# Patient Record
Sex: Female | Born: 1995 | Race: Black or African American | Hispanic: No | Marital: Single | Smoking: Former smoker
Health system: Southern US, Community
[De-identification: ages and names within clinical notes are randomized; demographics above are authoritative.]

## PROBLEM LIST (undated history)

## (undated) ENCOUNTER — Inpatient Hospital Stay (HOSPITAL_COMMUNITY): Payer: Self-pay

## (undated) DIAGNOSIS — Z789 Other specified health status: Secondary | ICD-10-CM

## (undated) DIAGNOSIS — O1490 Unspecified pre-eclampsia, unspecified trimester: Secondary | ICD-10-CM

## (undated) HISTORY — PX: NO PAST SURGERIES: SHX2092

---

## 2016-09-03 ENCOUNTER — Encounter (HOSPITAL_BASED_OUTPATIENT_CLINIC_OR_DEPARTMENT_OTHER): Payer: Self-pay | Admitting: *Deleted

## 2016-09-03 ENCOUNTER — Emergency Department (HOSPITAL_BASED_OUTPATIENT_CLINIC_OR_DEPARTMENT_OTHER)
Admission: EM | Admit: 2016-09-03 | Discharge: 2016-09-03 | Disposition: A | Discharge status: Home / Self Care | Payer: Medicaid Other | Attending: Emergency Medicine | Admitting: Emergency Medicine

## 2016-09-03 ENCOUNTER — Emergency Department (HOSPITAL_BASED_OUTPATIENT_CLINIC_OR_DEPARTMENT_OTHER): Payer: Medicaid Other

## 2016-09-03 DIAGNOSIS — F17210 Nicotine dependence, cigarettes, uncomplicated: Secondary | ICD-10-CM | POA: Diagnosis not present

## 2016-09-03 DIAGNOSIS — Z3A00 Weeks of gestation of pregnancy not specified: Secondary | ICD-10-CM | POA: Insufficient documentation

## 2016-09-03 DIAGNOSIS — Z3201 Encounter for pregnancy test, result positive: Secondary | ICD-10-CM | POA: Diagnosis not present

## 2016-09-03 DIAGNOSIS — Z3490 Encounter for supervision of normal pregnancy, unspecified, unspecified trimester: Secondary | ICD-10-CM

## 2016-09-03 DIAGNOSIS — B9689 Other specified bacterial agents as the cause of diseases classified elsewhere: Secondary | ICD-10-CM

## 2016-09-03 DIAGNOSIS — N760 Acute vaginitis: Secondary | ICD-10-CM

## 2016-09-03 LAB — URINALYSIS, ROUTINE W REFLEX MICROSCOPIC
Bilirubin Urine: NEGATIVE
Glucose, UA: NEGATIVE mg/dL
Hgb urine dipstick: NEGATIVE
KETONES UR: NEGATIVE mg/dL
LEUKOCYTES UA: NEGATIVE
NITRITE: NEGATIVE
PROTEIN: NEGATIVE mg/dL
Specific Gravity, Urine: 1.01 (ref 1.005–1.030)
pH: 6.5 (ref 5.0–8.0)

## 2016-09-03 LAB — WET PREP, GENITAL
Sperm: NONE SEEN
TRICH WET PREP: NONE SEEN
Yeast Wet Prep HPF POC: NONE SEEN

## 2016-09-03 LAB — HCG, QUANTITATIVE, PREGNANCY: hCG, Beta Chain, Quant, S: 175 m[IU]/mL — ABNORMAL HIGH (ref <5)

## 2016-09-03 MED ORDER — METRONIDAZOLE 500 MG PO TABS: 500.0000 mg | ORAL_TABLET | Freq: Two times a day (BID) | ORAL | 0 refills | Status: DC

## 2016-09-03 NOTE — ED Triage Notes (Addendum)
Pt sent here from [facility] for US to r/o etopic , denies any complaints or pain

## 2016-09-03 NOTE — ED Provider Notes (Signed)
[facility]-EMERGENCY DEPT [facility] Provider Note   CSN: [id number] Arrival date & time: 09/03/16  1720  By signing my name below, I, [name], attest that this documentation has been prepared under the direction and in the presence of Tilden Fossa, MD . Electronically Signed: [name], Scribe. 09/03/2016. 8:15 PM.  History   Chief Complaint Chief Complaint  Patient presents with  . Pregnancy Ultrasound    HPI Comments Jamie Bautista is a 21 y.o. female with no reported PMHx, who presents to the Emergency Department to confirm her pregnancy and r/o eptopic.  [facility] for [facility] this morning, when a urine pregnancy test was positive.  She was sent to [facility] when an abdominal US was performed with no detection of the fetus.  By [facility] on April 14th, pt should be 10wks which concerned the resource center.  They sent her to the ED to r/o ectopic.  Here, pt reports a hx of irregular menses, stating that her LKMP could be an inaccurate date.  Her only complaint is some lower abdominal pain and cramping.  Pt G3A2;  one miscarriage at 5wks and one induced abortion with pills.  No complications with either.  Pt denies experiencing any other acute sx.  The history is provided by the patient. No language interpreter was used.    History reviewed. No pertinent past medical history.  There are no active problems to display for this patient.   History reviewed. No pertinent surgical history.  OB History    No data available       Home Medications    Prior to Admission medications   Medication Sig Start Date End Date Taking? Authorizing Provider  metroNIDAZOLE (FLAGYL) 500 MG tablet Take 1 tablet (500 mg total) by mouth 2 (two) times daily. 09/03/16   Tilden Fossa, MD    Family History History reviewed. No pertinent family history.  Social History Social History  Substance Use Topics  . Smoking status: Current Every Day Smoker    Packs/day: 0.50    Types: Cigarettes  . Smokeless tobacco: Never Used  . Alcohol use No     Allergies   Patient has no known allergies.   Review of Systems Review of Systems  Constitutional: Negative for chills and fever.  Respiratory: Negative for shortness of breath.   Cardiovascular: Negative for chest pain.  Gastrointestinal: Positive for abdominal pain. Negative for nausea and vomiting.  Genitourinary: Negative for dysuria, pelvic pain, vaginal bleeding, vaginal discharge and vaginal pain.  Musculoskeletal: Negative for back pain.  Skin: Negative for pallor.  All other systems reviewed and are negative.  Physical Exam Updated Vital Signs BP 139/89 (BP Location: Left Arm)   Pulse 70   Temp 99 F (37.2 C) (Oral)   Resp 18   Ht 5\' 2"  (1.575 m)   Wt 171 lb (77.6 kg)   LMP 06/22/2016   SpO2 100%   BMI 31.28 kg/m   Physical Exam  Constitutional: She is oriented to person, place, and time. She appears well-developed and well-nourished.  HENT:  Head: Normocephalic and atraumatic.  Cardiovascular: Normal rate and regular rhythm.   No murmur heard. Pulmonary/Chest: Effort normal and breath sounds normal. No respiratory distress.  Abdominal: Soft. There is no tenderness. There is no rebound and no guarding.  Genitourinary: Vagina normal. No vaginal discharge found.  Genitourinary Comments: Os closed.  No CMT or adnexal tenderness  Musculoskeletal: She exhibits no edema or tenderness.  Neurological: She  is alert and oriented to person, place, and time.  Skin: Skin is warm and dry.  Psychiatric: She has a normal mood and affect. Her behavior is normal.  Nursing note and vitals reviewed.    ED Treatments / Results  DIAGNOSTIC STUDIES: Oxygen Saturation is 100% on room air, normal by my interpretation.    COORDINATION OF CARE: 5:41 PM Discussed treatment plan with pt at bedside and pt agreed to plan.  Labs (all labs ordered are listed, but only abnormal results  are displayed) Labs Reviewed  WET PREP, GENITAL - Abnormal; Notable for the following:       Result Value   Clue Cells Wet Prep HPF POC PRESENT (*)    WBC, Wet Prep HPF POC FEW (*)    All other components within normal limits  URINALYSIS, ROUTINE W REFLEX MICROSCOPIC - Abnormal; Notable for the following:    Color, Urine STRAW (*)    All other components within normal limits  HCG, QUANTITATIVE, PREGNANCY - Abnormal; Notable for the following:    hCG, Beta Chain, Quant, S 175 (*)    All other components within normal limits  RPR  HIV ANTIBODY (ROUTINE TESTING)  GC/CHLAMYDIA PROBE AMP ([place]) NOT [facility]    EKG  EKG Interpretation None       Radiology [place] Ob Comp < 14 Wks  Result Date: 09/03/2016 CLINICAL DATA:  Pregnant patient with pelvic pain. EXAM: OBSTETRIC <14 WK US AND TRANSVAGINAL OB US TECHNIQUE: Both transabdominal and transvaginal ultrasound examinations were performed for complete evaluation of the gestation as well as the maternal uterus, adnexal regions, and pelvic cul-de-sac. Transvaginal technique was performed to assess early pregnancy. COMPARISON:  None. FINDINGS: Intrauterine gestational sac: None Yolk sac:  Not Visualized. Embryo:  Not Visualized. Cardiac Activity: Not Visualized. Maternal uterus/adnexae: Within the left ovary there is a 2.3 x 1.6 cm complex cystic lesion suggestive of corpus luteum. This lesion appears to be located within the left ovary. The right ovary is normal in appearance. IMPRESSION: No intrauterine gestation identified. In the setting of positive pregnancy test and no definite intrauterine pregnancy, this reflects a pregnancy of unknown location. Differential considerations include early normal IUP, abnormal IUP, or nonvisualized ectopic pregnancy. Differentiation is achieved with serial beta HCG supplemented by repeat sonography as clinically warranted. Electronically Signed   By: Annia Beltrew  Davis M.D.   On: 09/03/2016 19:47   [place] Ob  Transvaginal  Result Date: 09/03/2016 CLINICAL DATA:  Pregnant patient with pelvic pain. EXAM: OBSTETRIC <14 WK US AND TRANSVAGINAL OB US TECHNIQUE: Both transabdominal and transvaginal ultrasound examinations were performed for complete evaluation of the gestation as well as the maternal uterus, adnexal regions, and pelvic cul-de-sac. Transvaginal technique was performed to assess early pregnancy. COMPARISON:  None. FINDINGS: Intrauterine gestational sac: None Yolk sac:  Not Visualized. Embryo:  Not Visualized. Cardiac Activity: Not Visualized. Maternal uterus/adnexae: Within the left ovary there is a 2.3 x 1.6 cm complex cystic lesion suggestive of corpus luteum. This lesion appears to be located within the left ovary. The right ovary is normal in appearance. IMPRESSION: No intrauterine gestation identified. In the setting of positive pregnancy test and no definite intrauterine pregnancy, this reflects a pregnancy of unknown location. Differential considerations include early normal IUP, abnormal IUP, or nonvisualized ectopic pregnancy. Differentiation is achieved with serial beta HCG supplemented by repeat sonography as clinically warranted. Electronically Signed   By: Annia Beltrew  Davis M.D.   On: 09/03/2016 19:47   Procedures Procedures (including critical care  time)  Medications Ordered in ED Medications - No data to display   Initial Impression / Assessment and Plan / ED Course  I have reviewed the triage vital signs and the nursing notes.  Pertinent labs & imaging results that were available during my care of the patient were reviewed by me and considered in my medical decision making (see chart for details).     Patient here for evaluation of positive urine pregnancy test. She has had mild lower abdominal cramping over the last week but nothing significant. Pelvic examination is benign with no evidence of active bleeding. Quantitative hCG is 175 and pelvic ultrasound with no evidence of  pregnancy. Discussed with patient importance of OB/GYN follow-up for close recheck. Discussed close return precautions. Discussed it is unclear if she has a normal pregnancy at this time.  I personally performed the services described in this documentation, which was scribed in my presence. The recorded information has been reviewed and is accurate.  Final Clinical Impressions(s) / ED Diagnoses   Final diagnoses:  Early stage of pregnancy  BV (bacterial vaginosis)    New Prescriptions New Prescriptions   METRONIDAZOLE (FLAGYL) 500 MG TABLET    Take 1 tablet (500 mg total) by mouth 2 (two) times daily.     Tilden Fossa, MD 09/03/16 2021

## 2016-09-03 NOTE — Discharge Instructions (Signed)
Start taking a prenatal vitamin, available over-the-counter. Your pregnancy hormone was 175 today. You are to early along in your pregnancy to determine if this is a normal or healthy pregnancy. Please contact your OB/GYN for recheck in the next few days. Get seen immediately if you develop abdominal pain, vaginal bleeding or new concerning symptoms.

## 2016-09-03 NOTE — ED Notes (Signed)
Pt verbalizes understanding of d/c instructions and denies any further need at this time. 

## 2016-09-03 NOTE — ED Notes (Signed)
Patient transported to Ultrasound 

## 2016-09-04 LAB — GC/CHLAMYDIA PROBE AMP (~~LOC~~) NOT AT ARMC
CHLAMYDIA, DNA PROBE: NEGATIVE
Neisseria Gonorrhea: NEGATIVE

## 2016-09-06 LAB — HIV ANTIBODY (ROUTINE TESTING W REFLEX): HIV SCREEN 4TH GENERATION: NONREACTIVE

## 2016-09-06 LAB — RPR: RPR Ser Ql: NONREACTIVE

## 2016-09-12 ENCOUNTER — Emergency Department (HOSPITAL_BASED_OUTPATIENT_CLINIC_OR_DEPARTMENT_OTHER)
Admission: EM | Admit: 2016-09-12 | Discharge: 2016-09-12 | Disposition: A | Discharge status: Home / Self Care | Payer: Medicaid Other | Attending: Emergency Medicine | Admitting: Emergency Medicine

## 2016-09-12 ENCOUNTER — Emergency Department (HOSPITAL_BASED_OUTPATIENT_CLINIC_OR_DEPARTMENT_OTHER): Payer: Medicaid Other

## 2016-09-12 ENCOUNTER — Encounter (HOSPITAL_BASED_OUTPATIENT_CLINIC_OR_DEPARTMENT_OTHER): Payer: Self-pay | Admitting: Emergency Medicine

## 2016-09-12 DIAGNOSIS — F17210 Nicotine dependence, cigarettes, uncomplicated: Secondary | ICD-10-CM | POA: Insufficient documentation

## 2016-09-12 DIAGNOSIS — O200 Threatened abortion: Secondary | ICD-10-CM | POA: Diagnosis not present

## 2016-09-12 DIAGNOSIS — O209 Hemorrhage in early pregnancy, unspecified: Secondary | ICD-10-CM | POA: Diagnosis present

## 2016-09-12 DIAGNOSIS — Z3A01 Less than 8 weeks gestation of pregnancy: Secondary | ICD-10-CM | POA: Insufficient documentation

## 2016-09-12 LAB — CBC
HCT: 34.6 % — ABNORMAL LOW (ref 36.0–46.0)
HEMOGLOBIN: 11.8 g/dL — AB (ref 12.0–15.0)
MCH: 30.2 pg (ref 26.0–34.0)
MCHC: 34.1 g/dL (ref 30.0–36.0)
MCV: 88.5 fL (ref 78.0–100.0)
PLATELETS: 230 10*3/uL (ref 150–400)
RBC: 3.91 MIL/uL (ref 3.87–5.11)
RDW: 14.9 % (ref 11.5–15.5)
WBC: 5.8 10*3/uL (ref 4.0–10.5)

## 2016-09-12 LAB — HCG, QUANTITATIVE, PREGNANCY: HCG, BETA CHAIN, QUANT, S: 8069 m[IU]/mL — AB (ref <5)

## 2016-09-12 NOTE — ED Provider Notes (Signed)
[facility]-EMERGENCY DEPT [facility] Provider Note   CSN: [id number] Arrival date & time: 09/12/16  1115     History   Chief Complaint Chief Complaint  Patient presents with  . Vaginal Bleeding    HPI Jamie Bautista is a 21 y.o. female.  HPI Patient is a G3 P0 A2 who presents to emergency department with possible six-week pregnancy.  She states she had an ultrasound and a Quant done on June 26 here the emergency department.  At that time her Sharene Butters was 175 and her ultrasound demonstrated no obvious intrauterine pregnancy at that time.  She states she's been doing well until she developed some vaginal bleeding today.  She reports some crampy lower abdominal pain.  She's had one miscarriage and one abortion.  She does not have a gynecologist at this time.  She denies fevers and chills.  No lightheadedness or syncope.  No upper abdominal pain.   History reviewed. No pertinent past medical history.  There are no active problems to display for this patient.   History reviewed. No pertinent surgical history.  OB History    Gravida Para Term Preterm AB Living   1             SAB TAB Ectopic Multiple Live Births                   Home Medications    Prior to Admission medications   Not on File    Family History History reviewed. No pertinent family history.  Social History Social History  Substance Use Topics  . Smoking status: Current Every Day Smoker    Packs/day: 0.50    Types: Cigarettes  . Smokeless tobacco: Never Used  . Alcohol use No     Allergies   Patient has no known allergies.   Review of Systems Review of Systems  All other systems reviewed and are negative.    Physical Exam Updated Vital Signs BP 130/71   Pulse 81   Temp 98.3 F (36.8 C) (Oral)   Resp 18   Ht 5\' 2"  (1.575 m)   Wt 77.6 kg (171 lb)   LMP 06/22/2016   SpO2 100%   BMI 31.28 kg/m   Physical Exam  Constitutional: She is oriented to person, place, and time. She appears well-developed  and well-nourished.  HENT:  Head: Normocephalic.  Eyes: EOM are normal.  Neck: Normal range of motion.  Pulmonary/Chest: Effort normal.  Abdominal: Soft. She exhibits no distension. There is no tenderness.  Musculoskeletal: Normal range of motion.  Neurological: She is alert and oriented to person, place, and time.  Psychiatric: She has a normal mood and affect.  Nursing note and vitals reviewed.    ED Treatments / Results  Labs (all labs ordered are listed, but only abnormal results are displayed) Labs Reviewed  HCG, QUANTITATIVE, PREGNANCY - Abnormal; Notable for the following:       Result Value   hCG, Beta Chain, Quant, S 8,069 (*)    All other components within normal limits  CBC - Abnormal; Notable for the following:    Hemoglobin 11.8 (*)    HCT 34.6 (*)    All other components within normal limits    EKG  EKG Interpretation None       Radiology US Ob Comp < 14 Wks  Result Date: 09/12/2016 CLINICAL DATA:  Vaginal bleeding EXAM: OBSTETRIC <14 WK [place] AND TRANSVAGINAL OB US TECHNIQUE: Both transabdominal and transvaginal ultrasound examinations were performed for  complete evaluation of the gestation as well as the maternal uterus, adnexal regions, and pelvic cul-de-sac. Transvaginal technique was performed to assess early pregnancy. COMPARISON:  September 03, 2016 FINDINGS: Intrauterine gestational sac: Visualized Yolk sac:  Visualized Embryo:  Not visualized Cardiac Activity: Not visualized MSD: 7 mm     5 w   3  d Subchorionic hemorrhage: There is a subchorionic hemorrhage measuring 1.3 x 1.3 cm. Maternal uterus/adnexae: Cervical os is closed. There is a probable hemorrhagic corpus luteum on the left measuring 1.7 x 1 3 x 1.3 cm. No other extrauterine pelvic or adnexal mass. No free pelvic fluid. IMPRESSION: Gestational sac and yolk sac visualized. Fetal pole not yet seen. Given this circumstance, a followup study in 10-14 days advised to further evaluate. Based on gestational  sac size, estimated gestational age is 5+ weeks. Moderate subchorionic hemorrhage. Probable hemorrhagic corpus luteum on the left. Electronically Signed   By: Bretta BangWilliam  Woodruff III M.D.   On: 09/12/2016 12:57   [place] Ob Transvaginal  Result Date: 09/12/2016 CLINICAL DATA:  Vaginal bleeding EXAM: OBSTETRIC <14 WK US AND TRANSVAGINAL OB US TECHNIQUE: Both transabdominal and transvaginal ultrasound examinations were performed for complete evaluation of the gestation as well as the maternal uterus, adnexal regions, and pelvic cul-de-sac. Transvaginal technique was performed to assess early pregnancy. COMPARISON:  September 03, 2016 FINDINGS: Intrauterine gestational sac: Visualized Yolk sac:  Visualized Embryo:  Not visualized Cardiac Activity: Not visualized MSD: 7 mm     5 w   3  d Subchorionic hemorrhage: There is a subchorionic hemorrhage measuring 1.3 x 1.3 cm. Maternal uterus/adnexae: Cervical os is closed. There is a probable hemorrhagic corpus luteum on the left measuring 1.7 x 1 3 x 1.3 cm. No other extrauterine pelvic or adnexal mass. No free pelvic fluid. IMPRESSION: Gestational sac and yolk sac visualized. Fetal pole not yet seen. Given this circumstance, a followup study in 10-14 days advised to further evaluate. Based on gestational sac size, estimated gestational age is 5+ weeks. Moderate subchorionic hemorrhage. Probable hemorrhagic corpus luteum on the left. Electronically Signed   By: Bretta BangWilliam  Woodruff III M.D.   On: 09/12/2016 12:57    Procedures Procedures (including critical care time)  Medications Ordered in ED Medications - No data to display   Initial Impression / Assessment and Plan / ED Course  I have reviewed the triage vital signs and the nursing notes.  Pertinent labs & imaging results that were available during my care of the patient were reviewed by me and considered in my medical decision making (see chart for details).    Patient is overall well-appearing.  Her Quant continues  to rise and is just above 8000 today.  Ultrasound has progressed some and now demonstrates a gestational sac but there is still no visualized fetal pole.  She will need repeat ultrasound and repeat Quant.  I [facility].  I gave her strict return precautions including worsening bleeding worsening pain as well as symptoms of syncope and lightheadedness.  At this point it is still a threatened miscarriage   Final Clinical Impressions(s) / ED Diagnoses   Final diagnoses:  Threatened miscarriage    New Prescriptions Current Discharge Medication List       Azalia Bilisampos, Antanisha Mohs, MD 09/12/16 [phone]

## 2016-09-12 NOTE — ED Triage Notes (Signed)
Pt states she is [redacted] weeks pregnant and woke up this morning with vaginal bleeding.

## 2016-11-04 ENCOUNTER — Encounter (HOSPITAL_COMMUNITY): Payer: Self-pay

## 2016-12-17 ENCOUNTER — Encounter (HOSPITAL_COMMUNITY): Payer: Self-pay | Admitting: *Deleted

## 2016-12-17 ENCOUNTER — Other Ambulatory Visit (HOSPITAL_COMMUNITY): Payer: Self-pay | Admitting: Nurse Practitioner

## 2016-12-17 DIAGNOSIS — O280 Abnormal hematological finding on antenatal screening of mother: Secondary | ICD-10-CM

## 2016-12-17 DIAGNOSIS — Z3A20 20 weeks gestation of pregnancy: Secondary | ICD-10-CM

## 2016-12-17 DIAGNOSIS — Z3689 Encounter for other specified antenatal screening: Secondary | ICD-10-CM

## 2016-12-18 ENCOUNTER — Ambulatory Visit (HOSPITAL_COMMUNITY)
Admission: RE | Admit: 2016-12-18 | Discharge: 2016-12-18 | Disposition: A | Discharge status: Home / Self Care | Payer: Medicaid Other | Source: Ambulatory Visit | Attending: Nurse Practitioner | Admitting: Nurse Practitioner

## 2016-12-18 ENCOUNTER — Encounter (HOSPITAL_COMMUNITY): Payer: Self-pay

## 2016-12-18 DIAGNOSIS — Z3A19 19 weeks gestation of pregnancy: Secondary | ICD-10-CM | POA: Diagnosis not present

## 2016-12-18 DIAGNOSIS — Z3689 Encounter for other specified antenatal screening: Secondary | ICD-10-CM

## 2016-12-18 DIAGNOSIS — O289 Unspecified abnormal findings on antenatal screening of mother: Secondary | ICD-10-CM | POA: Insufficient documentation

## 2016-12-18 DIAGNOSIS — O280 Abnormal hematological finding on antenatal screening of mother: Secondary | ICD-10-CM | POA: Diagnosis present

## 2016-12-18 DIAGNOSIS — Z3A20 20 weeks gestation of pregnancy: Secondary | ICD-10-CM | POA: Insufficient documentation

## 2016-12-18 HISTORY — DX: Other specified health status: Z78.9

## 2016-12-18 NOTE — Progress Notes (Signed)
Genetic Counseling  DOB: 04/05/1995 Ref03/21/1997g Provider: Alberteen Spindle, NP Appointment Date: 12/18/16 Attending: Dr. Particia Nearing  Jamie Bautista was seen for genetic counseling because of an increased risk for fetal Down syndrome based on a maternal serum Quad screen.  In summary:  Reviewed results of screening test  Increased risk for Down syndrome  Reduction in risk for Trisomy 23 and ONTD  Discussed additional screening options  NIPS - declined today but will consider at later date  Ultrasound - performed today  Discussed diagnostic testing options  Amniocentesis - declined  Reviewed family history concerns - none reported  Discussed general population carrier screening options  CF  SMA  Hemoglobinopathies  She was counseled regarding the screening result and the associated 1 in 46 risk for fetal Down syndrome.  We reviewed chromosomes, nondisjunction, and the common features and variable prognosis of Down syndrome.  In addition, we reviewed the screen adjusted reduction in risks for trisomy 18 and open neural tube defects.  We also discussed other explanations for a screen positive result including: a gestational dating error, differences in maternal metabolism, and normal variation.  We reviewed other available screening options including noninvasive prenatal screening (NIPS)/cell free DNA (cfDNA) screening, and detailed ultrasound.  She was counseled that screening tests are used to modify a patient's a priori risk for aneuploidy, typically based on age. This estimate provides a pregnancy specific risk assessment. We reviewed the benefits and limitations of each option. Specifically, we discussed the conditions for which each test screens, the detection rates, and false positive rates of each. She was also counseled regarding diagnostic testing via amniocentesis. We reviewed the approximate 1 in 300-500 risk for complications from amniocentesis, including  spontaneous pregnancy loss. We discussed the possible results that the tests might provide including: positive, negative, unanticipated, and no result. Finally, she was counseled regarding the cost of each option and potential out of pocket expenses.   A complete ultrasound was performed today. The ultrasound report will be sent under separate cover.  An isolated echogenic intracardiac focus (EIF) was seen on ultrasound today.  We discussed that this is generally believed to be a normal variation without any concerns for the pregnancy.  Isolated EIFs are not associated with congenital heart defects in the baby or compromised cardiac function after birth.  However, an echogenic cardiac focus is associated with a slightly increased chance for Down syndrome in the pregnancy.  There were no other visualized fetal anomalies or markers suggestive of aneuploidy. Additional screening and diagnostic testing were declined today.  She understands that screening tests cannot rule out all birth defects or genetic syndromes. The patient was advised of this limitation and states she still does not want additional testing or screening at this time.   [name] was provided with written information regarding cystic fibrosis (CF), spinal muscular atrophy (SMA) and hemoglobinopathies including the carrier frequency, availability of carrier screening and prenatal diagnosis if indicated.  In addition, we discussed that CF and hemoglobinopathies are routinely screened for as part of the [facility] newborn screening panel. She was counseled that she has previously undergone screening for CF and hemoglobin disorders, both which were normal per her medical records.  She declined screening for SMA today.  Both family histories were reviewed and found to be noncontributory for birth defects, genetic conditions or intellectual disabilites. Without further information regarding the provided family history, an accurate genetic risk cannot be  calculated. Further genetic counseling is warranted if more information is  obtained.  [name] denied exposure to environmental toxins or chemical agents. She denied the use of alcohol, tobacco or street drugs. She denied significant viral illnesses during the course of her pregnancy. Her medical and surgical histories were noncontributory.   I counseled [name] for approximately 45 minutes regarding the above risks and available options.   Jamie Gemma, MS,  Certified Genetic Counselor

## 2016-12-24 ENCOUNTER — Other Ambulatory Visit (HOSPITAL_COMMUNITY): Payer: Self-pay

## 2016-12-25 ENCOUNTER — Other Ambulatory Visit (HOSPITAL_COMMUNITY): Payer: Self-pay

## 2017-01-11 ENCOUNTER — Encounter (HOSPITAL_COMMUNITY): Payer: Self-pay

## 2017-01-11 ENCOUNTER — Inpatient Hospital Stay (HOSPITAL_COMMUNITY)
Admission: AD | Admit: 2017-01-11 | Discharge: 2017-01-11 | Disposition: A | Discharge status: Home / Self Care | Payer: Medicaid Other | Source: Ambulatory Visit | Attending: Obstetrics and Gynecology | Admitting: Obstetrics and Gynecology

## 2017-01-11 DIAGNOSIS — R350 Frequency of micturition: Secondary | ICD-10-CM

## 2017-01-11 DIAGNOSIS — O23592 Infection of other part of genital tract in pregnancy, second trimester: Secondary | ICD-10-CM | POA: Insufficient documentation

## 2017-01-11 DIAGNOSIS — Z3A23 23 weeks gestation of pregnancy: Secondary | ICD-10-CM | POA: Diagnosis not present

## 2017-01-11 DIAGNOSIS — R309 Painful micturition, unspecified: Secondary | ICD-10-CM

## 2017-01-11 DIAGNOSIS — R300 Dysuria: Secondary | ICD-10-CM | POA: Insufficient documentation

## 2017-01-11 DIAGNOSIS — R310 Gross hematuria: Secondary | ICD-10-CM | POA: Diagnosis not present

## 2017-01-11 DIAGNOSIS — Z87891 Personal history of nicotine dependence: Secondary | ICD-10-CM | POA: Diagnosis not present

## 2017-01-11 DIAGNOSIS — O9989 Other specified diseases and conditions complicating pregnancy, childbirth and the puerperium: Secondary | ICD-10-CM

## 2017-01-11 LAB — WET PREP, GENITAL
Clue Cells Wet Prep HPF POC: NONE SEEN
SPERM: NONE SEEN
Trich, Wet Prep: NONE SEEN
YEAST WET PREP: NONE SEEN

## 2017-01-11 LAB — URINALYSIS, ROUTINE W REFLEX MICROSCOPIC
Bilirubin Urine: NEGATIVE
GLUCOSE, UA: NEGATIVE mg/dL
Hgb urine dipstick: NEGATIVE
KETONES UR: 20 mg/dL — AB
LEUKOCYTES UA: NEGATIVE
NITRITE: NEGATIVE
PH: 5 (ref 5.0–8.0)
Protein, ur: NEGATIVE mg/dL
Specific Gravity, Urine: 1.023 (ref 1.005–1.030)

## 2017-01-11 MED ORDER — NITROFURANTOIN MONOHYD MACRO 100 MG PO CAPS: 100.0000 mg | ORAL_CAPSULE | Freq: Two times a day (BID) | ORAL | 0 refills | Status: DC

## 2017-01-11 NOTE — Discharge Instructions (Signed)

## 2017-01-11 NOTE — MAU Provider Note (Signed)
History   G3P0020 @ 23.4wks in with c/o blood in urine, burning with urination and urinary frequency. Denies ROM or vag bleeding.  CSN: [id number]  Arrival date & time 01/11/17  1830   None     Chief Complaint  Patient presents with  . Abdominal Pain  . Vaginal Bleeding  . Urinary Retention    HPI  Past Medical History:  Diagnosis Date  . Medical history non-contributory     Past Surgical History:  Procedure Laterality Date  . NO PAST SURGERIES      No family history on file.  Social History  Substance Use Topics  . Smoking status: Former Smoker    Packs/day: 0.50    Types: Cigarettes    Quit date: 10/27/2016  . Smokeless tobacco: Never Used  . Alcohol use No    OB History    Gravida Para Term Preterm AB Living   3       2 0   SAB TAB Ectopic Multiple Live Births   1 1            Review of Systems  Constitutional: Negative.   HENT: Negative.   Eyes: Negative.   Respiratory: Negative.   Cardiovascular: Negative.   Gastrointestinal: Positive for abdominal pain.  Endocrine: Negative.   Genitourinary: Positive for dysuria and frequency.  Musculoskeletal: Negative.   Skin: Negative.   Allergic/Immunologic: Negative.   Neurological: Negative.   Hematological: Negative.   Psychiatric/Behavioral: Negative.     Allergies  Patient has no known allergies.  Home Medications    BP 136/72   Pulse 82   Temp 98.8 F (37.1 C) (Oral)   Resp 16   LMP 06/22/2016 (Approximate)   SpO2 100%   Physical Exam  Constitutional: She is oriented to person, place, and time. She appears well-developed and well-nourished.  HENT:  Head: Normocephalic.  Eyes: Pupils are equal, round, and reactive to light.  Neck: Normal range of motion.  Cardiovascular: Normal rate, regular rhythm, normal heart sounds and intact distal pulses.   Pulmonary/Chest: Effort normal and breath sounds normal.  Abdominal: Soft. Bowel sounds are normal. There is tenderness.  Genitourinary:  Vagina normal and uterus normal.  Musculoskeletal: Normal range of motion.  Neurological: She is alert and oriented to person, place, and time. She has normal reflexes.  Skin: Skin is warm and dry.  Psychiatric: She has a normal mood and affect. Her behavior is normal. Judgment and thought content normal.    MAU Course  Procedures (including critical care time)  Labs Reviewed  WET PREP, GENITAL  URINALYSIS, ROUTINE W REFLEX MICROSCOPIC  GC/CHLAMYDIA PROBE AMP ([place]) NOT AT [facility]   No results found.   1. Gross hematuria   2. Urinary frequency   3. Pain with urination       MDM  VSS, FHR pattern reassurring, no decels, no uc's. SVE cl/firm/th/high. Wet prep . Cultures done. U/s neg . But since pt is symptomatic will culture and treat.

## 2017-01-11 NOTE — Progress Notes (Addendum)
G3P0 @ 23.[redacted] wksga. Presents to triage for vaginal bleeding, abdominal pain and painful urination. Denies LOF. + flutter movements.   EFM applied.   Provider at bs assessing.   2003: D/c instructions given with pt understanding. Pt left unit via ambulatory.

## 2017-01-13 LAB — URINE CULTURE

## 2017-01-13 LAB — GC/CHLAMYDIA PROBE AMP (~~LOC~~) NOT AT ARMC
Chlamydia: NEGATIVE
NEISSERIA GONORRHEA: NEGATIVE

## 2017-03-14 ENCOUNTER — Encounter (HOSPITAL_COMMUNITY): Payer: Self-pay | Admitting: *Deleted

## 2017-03-14 ENCOUNTER — Inpatient Hospital Stay (HOSPITAL_COMMUNITY)
Admission: AD | Admit: 2017-03-14 | Discharge: 2017-03-14 | Disposition: A | Discharge status: Home / Self Care | Payer: Medicaid Other | Source: Ambulatory Visit | Attending: Obstetrics & Gynecology | Admitting: Obstetrics & Gynecology

## 2017-03-14 DIAGNOSIS — O133 Gestational [pregnancy-induced] hypertension without significant proteinuria, third trimester: Secondary | ICD-10-CM | POA: Diagnosis not present

## 2017-03-14 DIAGNOSIS — Z3A32 32 weeks gestation of pregnancy: Secondary | ICD-10-CM

## 2017-03-14 DIAGNOSIS — O163 Unspecified maternal hypertension, third trimester: Secondary | ICD-10-CM | POA: Insufficient documentation

## 2017-03-14 DIAGNOSIS — Z79899 Other long term (current) drug therapy: Secondary | ICD-10-CM | POA: Insufficient documentation

## 2017-03-14 DIAGNOSIS — Z87891 Personal history of nicotine dependence: Secondary | ICD-10-CM | POA: Insufficient documentation

## 2017-03-14 LAB — URINALYSIS, ROUTINE W REFLEX MICROSCOPIC
Bilirubin Urine: NEGATIVE
Glucose, UA: NEGATIVE mg/dL
Hgb urine dipstick: NEGATIVE
KETONES UR: NEGATIVE mg/dL
LEUKOCYTES UA: NEGATIVE
NITRITE: NEGATIVE
PH: 7 (ref 5.0–8.0)
PROTEIN: NEGATIVE mg/dL
Specific Gravity, Urine: 1.014 (ref 1.005–1.030)

## 2017-03-14 LAB — CBC
HEMATOCRIT: 32.6 % — AB (ref 36.0–46.0)
Hemoglobin: 10.8 g/dL — ABNORMAL LOW (ref 12.0–15.0)
MCH: 29.8 pg (ref 26.0–34.0)
MCHC: 33.1 g/dL (ref 30.0–36.0)
MCV: 89.8 fL (ref 78.0–100.0)
Platelets: 147 10*3/uL — ABNORMAL LOW (ref 150–400)
RBC: 3.63 MIL/uL — ABNORMAL LOW (ref 3.87–5.11)
RDW: 14 % (ref 11.5–15.5)
WBC: 9.4 10*3/uL (ref 4.0–10.5)

## 2017-03-14 LAB — COMPREHENSIVE METABOLIC PANEL
ALBUMIN: 2.7 g/dL — AB (ref 3.5–5.0)
ALT: 10 U/L — ABNORMAL LOW (ref 14–54)
ANION GAP: 7 (ref 5–15)
AST: 17 U/L (ref 15–41)
Alkaline Phosphatase: 120 U/L (ref 38–126)
BILIRUBIN TOTAL: 0.3 mg/dL (ref 0.3–1.2)
BUN: 10 mg/dL (ref 6–20)
CO2: 22 mmol/L (ref 22–32)
Calcium: 8.6 mg/dL — ABNORMAL LOW (ref 8.9–10.3)
Chloride: 106 mmol/L (ref 101–111)
Creatinine, Ser: 0.73 mg/dL (ref 0.44–1.00)
GFR calc Af Amer: >60 mL/min (ref >60)
Glucose, Bld: 94 mg/dL (ref 65–99)
POTASSIUM: 4 mmol/L (ref 3.5–5.1)
Sodium: 135 mmol/L (ref 135–145)
TOTAL PROTEIN: 6.3 g/dL — AB (ref 6.5–8.1)

## 2017-03-14 LAB — PROTEIN / CREATININE RATIO, URINE
Creatinine, Urine: 73 mg/dL
PROTEIN CREATININE RATIO: 0.11 mg/mg{creat} (ref 0.00–0.15)
Total Protein, Urine: 8 mg/dL

## 2017-03-14 NOTE — MAU Provider Note (Signed)
History     CSN: [id number]  Arrival date and time: 03/14/17 6314   First Provider Initiated Contact with Patient 03/14/17 1917     Chief Complaint  Patient presents with  . Hypertension   HPI Jamie Bautista is a 22 y.o. G3P0020 at [redacted]w[redacted]d who presents for evaluation of elevated blood pressures. [facility] and states her BP was 150s/90s. She was sent home when the recheck was normal but was called today and told to come to MAU for labs. She denies any headache, visual changes or epigastric pain. She denies any vaginal bleeding or leaking. Denies pain. Reports good fetal movement.   OB History    Gravida Para Term Preterm AB Living   3       2 0   SAB TAB Ectopic Multiple Live Births   1 1            Past Medical History:  Diagnosis Date  . Medical history non-contributory     Past Surgical History:  Procedure Laterality Date  . NO PAST SURGERIES      History reviewed. No pertinent family history.  Social History   Tobacco Use  . Smoking status: Former Smoker    Packs/day: 0.50    Types: Cigarettes    Last attempt to quit: 10/27/2016    Years since quitting: 0.3  . Smokeless tobacco: Never Used  Substance Use Topics  . Alcohol use: No  . Drug use: Yes    Types: Marijuana    Comment: none in pregnancy    Allergies: No Known Allergies  Medications Prior to Admission  Medication Sig Dispense Refill Last Dose  . nitrofurantoin, macrocrystal-monohydrate, (MACROBID) 100 MG capsule Take 1 capsule (100 mg total) by mouth 2 (two) times daily. 10 capsule 0   . Prenatal Vit-Fe Fumarate-FA (PRENATAL VITAMIN PO) Take by mouth.   Taking    Review of Systems  Constitutional: Negative.  Negative for fatigue and fever.  HENT: Negative.   Respiratory: Negative.  Negative for shortness of breath.   Cardiovascular: Negative.  Negative for chest pain.  Gastrointestinal: Negative.  Negative for abdominal pain, constipation, diarrhea, nausea  and vomiting.  Genitourinary: Negative.  Negative for dysuria.  Neurological: Negative.  Negative for dizziness and headaches.   Physical Exam   Blood pressure (!) 156/88, pulse 60, temperature 98.4 F (36.9 C), temperature source Oral, resp. rate 16, last menstrual period 06/22/2016, SpO2 100 %.  Physical Exam  Nursing note and vitals reviewed. Constitutional: She is oriented to person, place, and time. She appears well-developed and well-nourished. No distress.  HENT:  Head: Normocephalic.  Eyes: Pupils are equal, round, and reactive to light.  Cardiovascular: Normal rate, regular rhythm and normal heart sounds.  Respiratory: Effort normal and breath sounds normal. No respiratory distress.  GI: Soft. Bowel sounds are normal. She exhibits no distension. There is no tenderness.  Neurological: She is alert and oriented to person, place, and time.  Skin: Skin is warm and dry.  Psychiatric: She has a normal mood and affect. Her behavior is normal. Judgment and thought content normal.   Patient Vitals for the past 24 hrs:  BP Temp Temp src Pulse Resp SpO2  03/14/17 1931 (!) 156/88 - - 60 - 100 %  03/14/17 1916 136/88 - - 64 - 100 %  03/14/17 1901 (!) 143/99 - - 73 - 99 %  03/14/17 1846 (!) 155/90 98.4 F (36.9 C) Oral 70 16 100 %  Fetal Tracing:  Baseline: 135 Variability: moderate Accels: 15x15 Decels: none  Toco: none  MAU Course  Procedures Results for orders placed or performed during the hospital encounter of 03/14/17 (from the past 24 hour(s))  Urinalysis, Routine w reflex microscopic     Status: Abnormal   Collection Time: 03/14/17  6:35 PM  Result Value Ref Range   Color, Urine STRAW (A) YELLOW   APPearance CLEAR CLEAR   Specific Gravity, Urine 1.014 1.005 - 1.030   pH 7.0 5.0 - 8.0   Glucose, UA NEGATIVE NEGATIVE mg/dL   Hgb urine dipstick NEGATIVE NEGATIVE   Bilirubin Urine NEGATIVE NEGATIVE   Ketones, ur NEGATIVE NEGATIVE mg/dL   Protein, ur NEGATIVE  NEGATIVE mg/dL   Nitrite NEGATIVE NEGATIVE   Leukocytes, UA NEGATIVE NEGATIVE  Protein / creatinine ratio, urine     Status: None   Collection Time: 03/14/17  6:35 PM  Result Value Ref Range   Creatinine, Urine 73.00 mg/dL   Total Protein, Urine 8 mg/dL   Protein Creatinine Ratio 0.11 0.00 - 0.15 mg/mg[Cre]  CBC     Status: Abnormal   Collection Time: 03/14/17  6:49 PM  Result Value Ref Range   WBC 9.4 4.0 - 10.5 K/uL   RBC 3.63 (L) 3.87 - 5.11 MIL/uL   Hemoglobin 10.8 (L) 12.0 - 15.0 g/dL   HCT 72.532.6 (L) 36.636.0 - 44.046.0 %   MCV 89.8 78.0 - 100.0 fL   MCH 29.8 26.0 - 34.0 pg   MCHC 33.1 30.0 - 36.0 g/dL   RDW 34.714.0 42.511.5 - 95.615.5 %   Platelets 147 (L) 150 - 400 K/uL  Comprehensive metabolic panel     Status: Abnormal   Collection Time: 03/14/17  6:49 PM  Result Value Ref Range   Sodium 135 135 - 145 mmol/L   Potassium 4.0 3.5 - 5.1 mmol/L   Chloride 106 101 - 111 mmol/L   CO2 22 22 - 32 mmol/L   Glucose, Bld 94 65 - 99 mg/dL   BUN 10 6 - 20 mg/dL   Creatinine, Ser 3.870.73 0.44 - 1.00 mg/dL   Calcium 8.6 (L) 8.9 - 10.3 mg/dL   Total Protein 6.3 (L) 6.5 - 8.1 g/dL   Albumin 2.7 (L) 3.5 - 5.0 g/dL   AST 17 15 - 41 U/L   ALT 10 (L) 14 - 54 U/L   Alkaline Phosphatase 120 38 - 126 U/L   Total Bilirubin 0.3 0.3 - 1.2 mg/dL   GFR calc non Af Amer >60 >60 mL/min   GFR calc Af Amer >60 >60 mL/min   Anion gap 7 5 - 15   MDM UA CBC, CMP, protein/creat ratio Reviewed results with Dr. Despina HiddenEure- ok to discharge patient home with follow up in clinic on Tuesday Assessment and Plan   1. Transient hypertension of pregnancy in third trimester   2. [redacted] weeks gestation of pregnancy    -Discharge home in stable condition -Preeclampsia precautions discussed -[facility] and start prenatal care -Patient may return to MAU as needed or if her condition were to change or worsen  Rolm BookbinderCaroline M Phelix Fudala CNM 03/14/2017, 7:58 PM

## 2017-03-14 NOTE — Discharge Instructions (Signed)
Hypertension During Pregnancy °Hypertension, commonly called high blood pressure, is when the force of blood pumping through your arteries is too strong. Arteries are blood vessels that carry blood from the heart throughout the body. Hypertension during pregnancy can cause problems for you and your baby. Your baby may be born early (prematurely) or may not weigh as much as he or she should at birth. Very bad cases of hypertension during pregnancy can be life-threatening. °Different types of hypertension can occur during pregnancy. These include: °· Chronic hypertension. This happens when: °? You have hypertension before pregnancy and it continues during pregnancy. °? You develop hypertension before you are [redacted] weeks pregnant, and it continues during pregnancy. °· Gestational hypertension. This is hypertension that develops after the 20th week of pregnancy. °· Preeclampsia, also called toxemia of pregnancy. This is a very serious type of hypertension that develops only during pregnancy. It affects the whole body, and it can be very dangerous for you and your baby. ° °Gestational hypertension and preeclampsia usually go away within 6 weeks after your baby is born. Women who have hypertension during pregnancy have a greater chance of developing hypertension later in life or during future pregnancies. °What are the causes? °The exact cause of hypertension is not known. °What increases the risk? °There are certain factors that make it more likely for you to develop hypertension during pregnancy. These include: °· Having hypertension during a previous pregnancy or prior to pregnancy. °· Being overweight. °· Being older than age 40. °· Being pregnant for the first time or being pregnant with more than one baby. °· Becoming pregnant using fertilization methods such as IVF (in vitro fertilization). °· Having diabetes, kidney problems, or systemic lupus erythematosus. °· Having a family history of hypertension. ° °What are the  signs or symptoms? °Chronic hypertension and gestational hypertension rarely cause symptoms. Preeclampsia causes symptoms, which may include: °· Increased protein in your urine. Your health care provider will check for this at every visit before you give birth (prenatal visit). °· Severe headaches. °· Sudden weight gain. °· Swelling of the hands, face, legs, and feet. °· Nausea and vomiting. °· Vision problems, such as blurred or double vision. °· Numbness in the face, arms, legs, and feet. °· Dizziness. °· Slurred speech. °· Sensitivity to bright lights. °· Abdominal pain. °· Convulsions. ° °How is this diagnosed? °You may be diagnosed with hypertension during a routine prenatal exam. At each prenatal visit, you may: °· Have a urine test to check for high amounts of protein in your urine. °· Have your blood pressure checked. A blood pressure reading is recorded as two numbers, such as "120 over 80" (or 120/80). The first ("top") number is called the systolic pressure. It is a measure of the pressure in your arteries when your heart beats. The second ("bottom") number is called the diastolic pressure. It is a measure of the pressure in your arteries as your heart relaxes between beats. Blood pressure is measured in a unit called mm Hg. A normal blood pressure reading is: °? Systolic: below 120. °? Diastolic: below 80. ° °The type of hypertension that you are diagnosed with depends on your test results and when your symptoms developed. °· Chronic hypertension is usually diagnosed before 20 weeks of pregnancy. °· Gestational hypertension is usually diagnosed after 20 weeks of pregnancy. °· Hypertension with high amounts of protein in the urine is diagnosed as preeclampsia. °· Blood pressure measurements that stay above 160 systolic, or above 110 diastolic, are   signs of severe preeclampsia. ° °How is this treated? °Treatment for hypertension during pregnancy varies depending on the type of hypertension you have and how  serious it is. °· If you take medicines called ACE inhibitors to treat chronic hypertension, you may need to switch medicines. ACE inhibitors should not be taken during pregnancy. °· If you have gestational hypertension, you may need to take blood pressure medicine. °· If you are at risk for preeclampsia, your health care provider may recommend that you take a low-dose aspirin every day to prevent high blood pressure during your pregnancy. °· If you have severe preeclampsia, you may need to be hospitalized so you and your baby can be monitored closely. You may also need to take medicine (magnesium sulfate) to prevent seizures and to lower blood pressure. This medicine may be given as an injection or through an IV tube. °· In some cases, if your condition gets worse, you may need to deliver your baby early. ° °Follow these instructions at home: °Eating and drinking °· Drink enough fluid to keep your urine clear or pale yellow. °· Eat a healthy diet that is low in salt (sodium). Do not add salt to your food. Check food labels to see how much sodium a food or beverage contains. °Lifestyle °· Do not use any products that contain nicotine or tobacco, such as cigarettes and e-cigarettes. If you need help quitting, ask your health care provider. °· Do not use alcohol. °· Avoid caffeine. °· Avoid stress as much as possible. Rest and get plenty of sleep. °General instructions °· Take over-the-counter and prescription medicines only as told by your health care provider. °· While lying down, lie on your left side. This keeps pressure off your baby. °· While sitting or lying down, raise (elevate) your feet. Try putting some pillows under your lower legs. °· Exercise regularly. Ask your health care provider what kinds of exercise are best for you. °· Keep all prenatal and follow-up visits as told by your health care provider. This is important. °Contact a health care provider if: °· You have symptoms that your health care  provider told you may require more treatment or monitoring, such as: °? Fever. °? Vomiting. °? Headache. °Get help right away if: °· You have severe abdominal pain or vomiting that does not get better with treatment. °· You suddenly develop swelling in your hands, ankles, or face. °· You gain 4 lbs (1.8 kg) or more in 1 week. °· You develop vaginal bleeding, or you have blood in your urine. °· You do not feel your baby moving as much as usual. °· You have blurred or double vision. °· You have muscle twitching or sudden tightening (spasms). °· You have shortness of breath. °· Your lips or fingernails turn blue. °This information is not intended to replace advice given to you by your health care provider. Make sure you discuss any questions you have with your health care provider. °Document Released: 11/13/2010 Document Revised: 09/15/2015 Document Reviewed: 08/11/2015 °Elsevier Interactive Patient Education © 2018 Elsevier Inc. ° °

## 2017-03-14 NOTE — MAU Note (Signed)
[facility] called pt today to come in for B/P check. Pt denies headache, but reports some blurred vision yesterday.

## 2017-03-18 ENCOUNTER — Encounter: Payer: Self-pay | Admitting: *Deleted

## 2017-03-19 ENCOUNTER — Ambulatory Visit (INDEPENDENT_AMBULATORY_CARE_PROVIDER_SITE_OTHER): Payer: Medicaid Other | Admitting: Obstetrics and Gynecology

## 2017-03-19 ENCOUNTER — Encounter: Payer: Self-pay | Admitting: Obstetrics and Gynecology

## 2017-03-19 VITALS — BP 145/91 | HR 66 | Wt 191.0 lb

## 2017-03-19 DIAGNOSIS — O133 Gestational [pregnancy-induced] hypertension without significant proteinuria, third trimester: Secondary | ICD-10-CM

## 2017-03-19 DIAGNOSIS — O0990 Supervision of high risk pregnancy, unspecified, unspecified trimester: Secondary | ICD-10-CM | POA: Insufficient documentation

## 2017-03-19 DIAGNOSIS — O0993 Supervision of high risk pregnancy, unspecified, third trimester: Secondary | ICD-10-CM

## 2017-03-19 DIAGNOSIS — O139 Gestational [pregnancy-induced] hypertension without significant proteinuria, unspecified trimester: Secondary | ICD-10-CM | POA: Insufficient documentation

## 2017-03-19 LAB — POCT URINALYSIS DIP (DEVICE)
Bilirubin Urine: NEGATIVE
GLUCOSE, UA: NEGATIVE mg/dL
Hgb urine dipstick: NEGATIVE
Ketones, ur: NEGATIVE mg/dL
NITRITE: NEGATIVE
PROTEIN: NEGATIVE mg/dL
Specific Gravity, Urine: 1.025 (ref 1.005–1.030)
UROBILINOGEN UA: 0.2 mg/dL (ref 0.0–1.0)
pH: 6 (ref 5.0–8.0)

## 2017-03-19 NOTE — Progress Notes (Signed)
Patient reports LUQ pain  

## 2017-03-19 NOTE — Patient Instructions (Addendum)
Come to the hospital if your blood pressure is greater than 159/105   Hypertension During Pregnancy Hypertension is also called high blood pressure. High blood pressure means that the force of your blood moving in your body is too strong. When you are pregnant, this condition should be watched carefully. It can cause problems for you and your baby. Follow these instructions at home: Eating and drinking  Drink enough fluid to keep your pee (urine) clear or pale yellow.  Eat healthy foods that are low in salt (sodium). ? Do not add salt to your food. ? Check labels on foods and drinks to see much salt is in them. Look on the label where you see "Sodium." Lifestyle  Do not use any products that contain nicotine or tobacco, such as cigarettes and e-cigarettes. If you need help quitting, ask your doctor.  Do not use alcohol.  Avoid caffeine.  Avoid stress. Rest and get plenty of sleep. General instructions  Take over-the-counter and prescription medicines only as told by your doctor.  While lying down, lie on your left side. This keeps pressure off your baby.  While sitting or lying down, raise (elevate) your feet. Try putting some pillows under your lower legs.  Exercise regularly. Ask your doctor what kinds of exercise are best for you.  Keep all prenatal and follow-up visits as told by your doctor. This is important. Contact a doctor if:  You have symptoms that your doctor told you to watch for, such as: ? Fever. ? Throwing up (vomiting). ? Headache. Get help right away if:  You have very bad pain in your belly (abdomen).  You are throwing up, and this does not get better with treatment.  You suddenly get swelling in your hands, ankles, or face.  You get bleeding from your vagina.  You have blood in your pee.  You do not feel your baby moving as much as normal.  You have a change in vision.  You have muscle twitching or sudden tightening (spasms).  You have  trouble breathing.  Your lips or fingernails turn blue. This information is not intended to replace advice given to you by your health care provider. Make sure you discuss any questions you have with your health care provider. Document Released: 03/30/2010 Document Revised: 11/07/2015 Document Reviewed: 11/07/2015 Elsevier Interactive Patient Education  2018 [employer].    Places to have your son circumcised:    [facility] [phone] while you are in hospital  [facility] [phone] $244 by 4 wks  [facility] [phone] $175 by 2 wks  [facility] [phone] $250 by 7 days [facility] [phone] $150 by 4 wks  These prices sometimes change but are roughly what you can expect to pay. Please call and confirm pricing.   Circumcision is considered an elective/non-medically necessary procedure. There are many reasons parents decide to have their sons circumsized. During the first year of life circumcised males have a reduced risk of urinary tract infections but after this year the rates between circumcised males and uncircumcised males are the same.  It is safe to have your son circumcised outside of the hospital and the places above perform them regularly.   AREA PEDIATRIC/FAMILY PRACTICE PHYSICIANS  [facility] [address] [place], [place]  [address] Phone - [phone]   Fax - [phone]  [facility] [address] [place], [place] [address] Phone - [phone]   Fax - [phone]  [facility] [phone]  [address] [place], [place]  [address] Phone - [phone]   Fax - [phone]  [facility] [address] [place], [place]  [address] Phone - [phone]   Fax - [phone]  [facility] [address] [place], [place]  [address] Phone - [phone]   Fax - [phone]  [facility] [address], [address] [place], [place]  [address] Phone - [phone]   Fax - [phone]  [facility] [address] [place], [place]  [address] Phone - [phone]   Fax - [phone]  [facility] [address] [place], [place]  [address] Phone - [phone]   Fax - [phone]  [facility] [address] [place], [place]  [address] Phone - [phone]   Fax - [phone] [facility] [address] [place], [place]  [address] Phone - [phone]   Fax - [phone]  [facility] [address] [place], [place]  [address] Phone - [phone]   Fax - [phone]  [facility] [address], [address] [place], [place]  [address] Phone - [phone]   Fax - [phone]  [facility] [address] [place], [place]  [address] Phone - [phone]   Fax - [phone]  [facility] [address], [address] [place], [place]  [address] Phone - [phone]  [facility] [address] [place], [place]  [address] Phone - [phone]   Fax - [phone]  [facility] [address], [address] [place], [place]  [address] Phone - [phone]   Fax - [phone]  [facility] [address] [place], [place]  [address] Phone - [phone]   Fax - [phone]  [facility] [address] [place], [place]  [address] Phone - [phone]   Fax - [phone]   [facility] [address], [address] [place], [place]  [address] Phone - [phone]   Fax - [phone]  [facility] [address], [address] [place], [place]  [address] Phone - [phone]   Fax -  [phone]  [facility] R[facility] [address] [place], [place]  [address] Phone - [phone]   Fax - [phone]  [facility] [address] [place], [place]  [address] Phone - [phone]   Fax - [phone]  [facility] [address] [place], [place]  [address] Phone - [phone]   Fax - [phone] [facility] [place] [address] [place], [place]  [address] Phone - [phone]   Fax - [phone]  [facility] [address] [place], [place]  [address] Phone - [phone]   Fax - [phone]  [facility] [place] [address] [place], [place]  [address] Phone - [phone]   Fax - [phone]  [facility] [place] Wyvonne Lenzharlene Flemming MD [address] [place] [address] Phone [phone]  Fax [phone]

## 2017-03-19 NOTE — Progress Notes (Signed)
New OB Visit Note Date: 03/19/2017 Clinic: [facility]  Subjective:  Jamie Bautista is a 22 y.o. G3P0020 at 4577w1d being seen today for ongoing prenatal care.  She is currently monitored for the following issues for this high-risk pregnancy and has Abnormal findings on antenatal screening; Gestational hypertension; and Supervision of high risk pregnancy, antepartum on their problem list.   She is a [facility] transfer of care due to Jamie Bautista  Patient reports no complaints.   Contractions: Not present. Vag. Bleeding: None.  Movement: Present. Denies leaking of fluid.   The following portions of the patient's history were reviewed and updated as appropriate: allergies, current medications, past family history, past medical history, past social history, past surgical history and problem list. Problem list updated.  Objective:   Vitals:   03/19/17 1427 03/19/17 1437  BP: (!) 156/86 (!) 145/91  Pulse: 70 66  Weight: 191 lb (86.6 kg)     Fetal Status: Fetal Heart Rate (bpm): 150   Movement: Present     General:  Alert, oriented and cooperative. Patient is in no acute distress.  Skin: Skin is warm and dry. No rash noted.   Cardiovascular: Normal heart rate noted  Respiratory: Normal respiratory effort, no problems with respiration noted  Abdomen: Soft, gravid, appropriate for gestational age. Pain/Pressure: Present. No ruq pain  Pelvic:  Cervical exam deferred        Extremities: Normal range of motion.  Edema: None  Mental Status: Normal mood and affect. Normal behavior. Normal judgment and thought content.   Urinalysis: Urine Protein: Negative Urine Glucose: Negative  Assessment and Plan:  Pregnancy: G3P0020 at 4577w1d  1. Gestational hypertension, third trimester D/w pt re: s/s of pre-eclampsia and ap testing and 37wk delivery. BPs not severe range so won't start medications. Will schedule asap growth and bpp. nst reactive today. qwk surveillance labs done - US MFM OB  FOLLOW UP; Future - US MFM FETAL BPP WO NON STRESS; Future - CMP and Liver - CBC  2. Supervision of high risk pregnancy, antepartum - US MFM OB FOLLOW UP; Future - US MFM FETAL BPP WO NON STRESS; Future - CMP and Liver - CBC  Preterm labor symptoms and general obstetric precautions including but not limited to vaginal bleeding, contractions, leaking of fluid and fetal movement were reviewed in detail with the patient. Please refer to After Visit Summary for other counseling recommendations.  Return in about 1 week (around 03/26/2017) for nst/bpp/hrob visit.   Cook BingPickens, Jonet Mathies, MD

## 2017-03-20 ENCOUNTER — Encounter: Payer: Self-pay | Admitting: Obstetrics and Gynecology

## 2017-03-20 LAB — CMP AND LIVER
ALK PHOS: 138 IU/L — AB (ref 39–117)
ALT: 7 IU/L (ref 0–32)
AST: 13 IU/L (ref 0–40)
Albumin: 3.1 g/dL — ABNORMAL LOW (ref 3.5–5.5)
BILIRUBIN, DIRECT: 0.05 mg/dL (ref 0.00–0.40)
BUN: 6 mg/dL (ref 6–20)
Bilirubin Total: <0.2 mg/dL (ref 0.0–1.2)
CHLORIDE: 106 mmol/L (ref 96–106)
CO2: 22 mmol/L (ref 20–29)
Calcium: 8.4 mg/dL — ABNORMAL LOW (ref 8.7–10.2)
Creatinine, Ser: 0.65 mg/dL (ref 0.57–1.00)
GFR calc Af Amer: 147 mL/min/{1.73_m2} (ref >59)
GFR calc non Af Amer: 127 mL/min/{1.73_m2} (ref >59)
Glucose: 108 mg/dL — ABNORMAL HIGH (ref 65–99)
POTASSIUM: 3.7 mmol/L (ref 3.5–5.2)
SODIUM: 138 mmol/L (ref 134–144)
TOTAL PROTEIN: 6.1 g/dL (ref 6.0–8.5)

## 2017-03-20 LAB — CBC
HEMATOCRIT: 34.1 % (ref 34.0–46.6)
Hemoglobin: 11.2 g/dL (ref 11.1–15.9)
MCH: 29.9 pg (ref 26.6–33.0)
MCHC: 32.8 g/dL (ref 31.5–35.7)
MCV: 91 fL (ref 79–97)
PLATELETS: 161 10*3/uL (ref 150–379)
RBC: 3.75 x10E6/uL — ABNORMAL LOW (ref 3.77–5.28)
RDW: 13.9 % (ref 12.3–15.4)
WBC: 8.1 10*3/uL (ref 3.4–10.8)

## 2017-03-24 ENCOUNTER — Ambulatory Visit (INDEPENDENT_AMBULATORY_CARE_PROVIDER_SITE_OTHER): Payer: Medicaid Other | Admitting: *Deleted

## 2017-03-24 VITALS — BP 135/95 | HR 72

## 2017-03-24 DIAGNOSIS — O133 Gestational [pregnancy-induced] hypertension without significant proteinuria, third trimester: Secondary | ICD-10-CM

## 2017-03-24 NOTE — Progress Notes (Signed)
Pt denies H/A or visual disturbances.  US for growth and BPP scheduled on 03/26/17.

## 2017-03-26 ENCOUNTER — Ambulatory Visit (HOSPITAL_COMMUNITY)
Admission: RE | Admit: 2017-03-26 | Discharge: 2017-03-26 | Disposition: A | Discharge status: Home / Self Care | Payer: Medicaid Other | Source: Ambulatory Visit | Attending: Obstetrics and Gynecology | Admitting: Obstetrics and Gynecology

## 2017-03-26 ENCOUNTER — Other Ambulatory Visit: Payer: Self-pay | Admitting: Obstetrics and Gynecology

## 2017-03-26 ENCOUNTER — Encounter (HOSPITAL_COMMUNITY): Payer: Self-pay

## 2017-03-26 DIAGNOSIS — O281 Abnormal biochemical finding on antenatal screening of mother: Secondary | ICD-10-CM | POA: Diagnosis not present

## 2017-03-26 DIAGNOSIS — O0993 Supervision of high risk pregnancy, unspecified, third trimester: Secondary | ICD-10-CM | POA: Insufficient documentation

## 2017-03-26 DIAGNOSIS — O0990 Supervision of high risk pregnancy, unspecified, unspecified trimester: Secondary | ICD-10-CM

## 2017-03-26 DIAGNOSIS — Z3A34 34 weeks gestation of pregnancy: Secondary | ICD-10-CM

## 2017-03-26 DIAGNOSIS — O133 Gestational [pregnancy-induced] hypertension without significant proteinuria, third trimester: Secondary | ICD-10-CM | POA: Diagnosis present

## 2017-03-26 DIAGNOSIS — O289 Unspecified abnormal findings on antenatal screening of mother: Secondary | ICD-10-CM | POA: Insufficient documentation

## 2017-03-26 NOTE — Addendum Note (Signed)
Encounter addended by: Marcellina MillinMoser, Gilliam Hawkes L, RTR on: 03/26/2017 3:28 PM  Actions taken: Imaging Exam ended

## 2017-03-26 NOTE — Procedures (Signed)
Jamie BerkshireCierra J Bautista 12/13/1995 1165w1d  Fetus A Non-Stress Test Interpretation for 03/26/17  Indication: Unsatisfactory BPP  Fetal Heart Rate A Mode: External Baseline Rate (A): 145 bpm Variability: Moderate Accelerations: 15 x 15 Decelerations: None Multiple birth?: No  Uterine Activity Mode: Toco Contraction Frequency (min): none noted  Interpretation (Fetal Testing) Nonstress Test Interpretation: Reactive Comments: FHR tracing rev'd by Dr. Ezzard StandingNewman

## 2017-03-31 ENCOUNTER — Encounter (HOSPITAL_COMMUNITY)
Admission: AD | Disposition: A | Discharge status: Home / Self Care | Payer: Self-pay | Source: Ambulatory Visit | Attending: Obstetrics and Gynecology

## 2017-03-31 ENCOUNTER — Inpatient Hospital Stay (HOSPITAL_COMMUNITY): Payer: Medicaid Other

## 2017-03-31 ENCOUNTER — Inpatient Hospital Stay (HOSPITAL_COMMUNITY): Payer: Medicaid Other | Admitting: Anesthesiology

## 2017-03-31 ENCOUNTER — Inpatient Hospital Stay (HOSPITAL_COMMUNITY)
Admission: AD | Admit: 2017-03-31 | Discharge: 2017-04-03 | DRG: 786 | Disposition: A | Discharge status: Home / Self Care | Payer: Medicaid Other | Source: Ambulatory Visit | Attending: Obstetrics and Gynecology | Admitting: Obstetrics and Gynecology

## 2017-03-31 DIAGNOSIS — Z3A34 34 weeks gestation of pregnancy: Secondary | ICD-10-CM

## 2017-03-31 DIAGNOSIS — O1494 Unspecified pre-eclampsia, complicating childbirth: Secondary | ICD-10-CM

## 2017-03-31 DIAGNOSIS — Z87891 Personal history of nicotine dependence: Secondary | ICD-10-CM

## 2017-03-31 DIAGNOSIS — Z98891 History of uterine scar from previous surgery: Secondary | ICD-10-CM

## 2017-03-31 DIAGNOSIS — O1414 Severe pre-eclampsia complicating childbirth: Secondary | ICD-10-CM | POA: Diagnosis present

## 2017-03-31 DIAGNOSIS — O368390 Maternal care for abnormalities of the fetal heart rate or rhythm, unspecified trimester, not applicable or unspecified: Secondary | ICD-10-CM

## 2017-03-31 DIAGNOSIS — O134 Gestational [pregnancy-induced] hypertension without significant proteinuria, complicating childbirth: Secondary | ICD-10-CM | POA: Diagnosis not present

## 2017-03-31 DIAGNOSIS — O1415 Severe pre-eclampsia, complicating the puerperium: Secondary | ICD-10-CM | POA: Diagnosis not present

## 2017-03-31 DIAGNOSIS — O4593 Premature separation of placenta, unspecified, third trimester: Secondary | ICD-10-CM | POA: Diagnosis present

## 2017-03-31 DIAGNOSIS — R1030 Lower abdominal pain, unspecified: Secondary | ICD-10-CM | POA: Diagnosis present

## 2017-03-31 LAB — CBC
HCT: 36.3 % (ref 36.0–46.0)
Hemoglobin: 12.1 g/dL (ref 12.0–15.0)
MCH: 29.7 pg (ref 26.0–34.0)
MCHC: 33.3 g/dL (ref 30.0–36.0)
MCV: 89 fL (ref 78.0–100.0)
PLATELETS: 136 10*3/uL — AB (ref 150–400)
RBC: 4.08 MIL/uL (ref 3.87–5.11)
RDW: 14.3 % (ref 11.5–15.5)
WBC: 9.3 10*3/uL (ref 4.0–10.5)

## 2017-03-31 LAB — COMPREHENSIVE METABOLIC PANEL
ALK PHOS: 182 U/L — AB (ref 38–126)
ALT: 11 U/L — ABNORMAL LOW (ref 14–54)
ANION GAP: 7 (ref 5–15)
AST: 24 U/L (ref 15–41)
Albumin: 2.9 g/dL — ABNORMAL LOW (ref 3.5–5.0)
BUN: 11 mg/dL (ref 6–20)
CALCIUM: 8.8 mg/dL — AB (ref 8.9–10.3)
CO2: 19 mmol/L — ABNORMAL LOW (ref 22–32)
CREATININE: 0.87 mg/dL (ref 0.44–1.00)
Chloride: 107 mmol/L (ref 101–111)
Glucose, Bld: 103 mg/dL — ABNORMAL HIGH (ref 65–99)
Potassium: 4.1 mmol/L (ref 3.5–5.1)
SODIUM: 133 mmol/L — AB (ref 135–145)
TOTAL PROTEIN: 6.8 g/dL (ref 6.5–8.1)
Total Bilirubin: 0.2 mg/dL — ABNORMAL LOW (ref 0.3–1.2)

## 2017-03-31 LAB — URINALYSIS, ROUTINE W REFLEX MICROSCOPIC
Bilirubin Urine: NEGATIVE
GLUCOSE, UA: NEGATIVE mg/dL
Hgb urine dipstick: NEGATIVE
Ketones, ur: NEGATIVE mg/dL
Leukocytes, UA: NEGATIVE
Nitrite: NEGATIVE
PH: 7 (ref 5.0–8.0)
SPECIFIC GRAVITY, URINE: 1.009 (ref 1.005–1.030)

## 2017-03-31 LAB — TYPE AND SCREEN
ABO/RH(D): A POS
Antibody Screen: NEGATIVE

## 2017-03-31 LAB — URIC ACID: URIC ACID, SERUM: 4.8 mg/dL (ref 2.3–6.6)

## 2017-03-31 LAB — LACTATE DEHYDROGENASE: LDH: 176 U/L (ref 98–192)

## 2017-03-31 SURGERY — Surgical Case
Anesthesia: General

## 2017-03-31 MED ORDER — FENTANYL CITRATE (PF) 250 MCG/5ML IJ SOLN
INTRAMUSCULAR | Status: AC
  Filled 2017-03-31: qty 5

## 2017-03-31 MED ORDER — HYDROMORPHONE HCL 1 MG/ML IJ SOLN
INTRAMUSCULAR | Status: AC
  Filled 2017-03-31: qty 1

## 2017-03-31 MED ORDER — MIDAZOLAM HCL 2 MG/2ML IJ SOLN
INTRAMUSCULAR | Status: AC
  Filled 2017-03-31: qty 2

## 2017-03-31 MED ORDER — LABETALOL HCL 5 MG/ML IV SOLN: 20.0000 mg | INTRAVENOUS | Status: DC | PRN

## 2017-03-31 MED ORDER — MIDAZOLAM HCL 2 MG/2ML IJ SOLN
INTRAMUSCULAR | Status: DC | PRN
  Administered 2017-03-31: 2 mg via INTRAVENOUS

## 2017-03-31 MED ORDER — FENTANYL CITRATE (PF) 100 MCG/2ML IJ SOLN
INTRAMUSCULAR | Status: AC
  Filled 2017-03-31: qty 2

## 2017-03-31 MED ORDER — OXYCODONE HCL 5 MG PO TABS: 5.0000 mg | ORAL_TABLET | Freq: Once | ORAL | Status: DC | PRN

## 2017-03-31 MED ORDER — MAGNESIUM SULFATE BOLUS VIA INFUSION
4.0000 g | Freq: Once | INTRAVENOUS | Status: AC
  Administered 2017-03-31: 4 g via INTRAVENOUS
  Filled 2017-03-31: qty 500

## 2017-03-31 MED ORDER — DEXAMETHASONE SODIUM PHOSPHATE 10 MG/ML IJ SOLN
INTRAMUSCULAR | Status: AC
  Filled 2017-03-31: qty 1

## 2017-03-31 MED ORDER — LABETALOL HCL 5 MG/ML IV SOLN
INTRAVENOUS | Status: DC | PRN
  Administered 2017-03-31: 10 mg via INTRAVENOUS
  Administered 2017-03-31 (×2): 5 mg via INTRAVENOUS

## 2017-03-31 MED ORDER — PHENYLEPHRINE 40 MCG/ML (10ML) SYRINGE FOR IV PUSH (FOR BLOOD PRESSURE SUPPORT)
PREFILLED_SYRINGE | INTRAVENOUS | Status: AC
Start: 2017-03-31 — End: ?
  Filled 2017-03-31: qty 10

## 2017-03-31 MED ORDER — OXYTOCIN 10 UNIT/ML IJ SOLN
INTRAVENOUS | Status: DC | PRN
  Administered 2017-03-31: 40 [IU] via INTRAVENOUS

## 2017-03-31 MED ORDER — FENTANYL CITRATE (PF) 100 MCG/2ML IJ SOLN
INTRAMUSCULAR | Status: DC | PRN
  Administered 2017-03-31: 250 ug via INTRAVENOUS
  Administered 2017-03-31 (×2): 100 ug via INTRAVENOUS
  Administered 2017-03-31: 50 ug via INTRAVENOUS

## 2017-03-31 MED ORDER — PHENYLEPHRINE HCL 10 MG/ML IJ SOLN
INTRAMUSCULAR | Status: DC | PRN
  Administered 2017-03-31 (×3): 80 ug via INTRAVENOUS

## 2017-03-31 MED ORDER — HYDRALAZINE HCL 20 MG/ML IJ SOLN
INTRAMUSCULAR | Status: AC
  Filled 2017-03-31: qty 1

## 2017-03-31 MED ORDER — PROPOFOL 10 MG/ML IV BOLUS
INTRAVENOUS | Status: AC
  Filled 2017-03-31: qty 20

## 2017-03-31 MED ORDER — OXYCODONE HCL 5 MG/5ML PO SOLN: 5.0000 mg | Freq: Once | ORAL | Status: DC | PRN

## 2017-03-31 MED ORDER — ONDANSETRON HCL 4 MG/2ML IJ SOLN
INTRAMUSCULAR | Status: AC
  Filled 2017-03-31: qty 2

## 2017-03-31 MED ORDER — CEFAZOLIN SODIUM-DEXTROSE 2-4 GM/100ML-% IV SOLN
INTRAVENOUS | Status: AC
Start: 2017-03-31 — End: ?
  Filled 2017-03-31: qty 100

## 2017-03-31 MED ORDER — HYDROMORPHONE HCL 1 MG/ML IJ SOLN
0.2500 mg | INTRAMUSCULAR | Status: DC | PRN
  Administered 2017-03-31 (×3): 0.5 mg via INTRAVENOUS

## 2017-03-31 MED ORDER — DEXAMETHASONE SODIUM PHOSPHATE 10 MG/ML IJ SOLN
INTRAMUSCULAR | Status: DC | PRN
  Administered 2017-03-31: 10 mg via INTRAVENOUS

## 2017-03-31 MED ORDER — PROMETHAZINE HCL 25 MG/ML IJ SOLN: 6.2500 mg | INTRAMUSCULAR | Status: DC | PRN

## 2017-03-31 MED ORDER — EPHEDRINE SULFATE 50 MG/ML IJ SOLN
INTRAMUSCULAR | Status: DC | PRN
  Administered 2017-03-31 (×3): 5 mg via INTRAVENOUS

## 2017-03-31 MED ORDER — SCOPOLAMINE 1 MG/3DAYS TD PT72
MEDICATED_PATCH | TRANSDERMAL | Status: AC
  Filled 2017-03-31: qty 1

## 2017-03-31 MED ORDER — HYDRALAZINE HCL 20 MG/ML IJ SOLN
10.0000 mg | Freq: Once | INTRAMUSCULAR | Status: AC | PRN
  Administered 2017-03-31: 4 mg via INTRAVENOUS

## 2017-03-31 MED ORDER — LACTATED RINGERS IV SOLN
INTRAVENOUS | Status: DC | PRN
  Administered 2017-03-31 (×3): via INTRAVENOUS

## 2017-03-31 MED ORDER — CEFAZOLIN SODIUM-DEXTROSE 2-3 GM-%(50ML) IV SOLR
INTRAVENOUS | Status: DC | PRN
  Administered 2017-03-31: 2 g via INTRAVENOUS

## 2017-03-31 MED ORDER — SUCCINYLCHOLINE CHLORIDE 200 MG/10ML IV SOSY
PREFILLED_SYRINGE | INTRAVENOUS | Status: AC
  Filled 2017-03-31: qty 10

## 2017-03-31 MED ORDER — SUCCINYLCHOLINE CHLORIDE 20 MG/ML IJ SOLN
INTRAMUSCULAR | Status: DC | PRN
  Administered 2017-03-31: 100 mg via INTRAVENOUS

## 2017-03-31 MED ORDER — ONDANSETRON HCL 4 MG/2ML IJ SOLN
INTRAMUSCULAR | Status: DC | PRN
  Administered 2017-03-31: 4 mg via INTRAVENOUS

## 2017-03-31 MED ORDER — LABETALOL HCL 5 MG/ML IV SOLN
INTRAVENOUS | Status: AC
  Filled 2017-03-31: qty 4

## 2017-03-31 MED ORDER — MAGNESIUM SULFATE 40 G IN LACTATED RINGERS - SIMPLE
2.0000 g/h | INTRAVENOUS | Status: AC
  Administered 2017-03-31 – 2017-04-01 (×2): 2 g/h via INTRAVENOUS
  Filled 2017-03-31 (×2): qty 40

## 2017-03-31 MED ORDER — OXYTOCIN 10 UNIT/ML IJ SOLN
INTRAMUSCULAR | Status: AC
  Filled 2017-03-31: qty 4

## 2017-03-31 MED ORDER — PROPOFOL 10 MG/ML IV BOLUS
INTRAVENOUS | Status: DC | PRN
  Administered 2017-03-31: 200 mg via INTRAVENOUS

## 2017-03-31 SURGICAL SUPPLY — 32 items
APL SKNCLS STERI-STRIP NONHPOA (GAUZE/BANDAGES/DRESSINGS) ×1
BENZOIN TINCTURE PRP APPL 2/3 (GAUZE/BANDAGES/DRESSINGS) ×2 IMPLANT
BRR ADH 6X5 SEPRAFILM 1 SHT (MISCELLANEOUS)
CHLORAPREP W/TINT 26ML (MISCELLANEOUS) ×3 IMPLANT
CLAMP CORD UMBIL (MISCELLANEOUS) IMPLANT
CLOSURE WOUND 1/2 X4 (GAUZE/BANDAGES/DRESSINGS) ×1
DRSG OPSITE POSTOP 4X10 (GAUZE/BANDAGES/DRESSINGS) ×3 IMPLANT
ELECT REM PT RETURN 9FT ADLT (ELECTROSURGICAL) ×3
ELECTRODE REM PT RTRN 9FT ADLT (ELECTROSURGICAL) ×1 IMPLANT
EXTRACTOR VACUUM M CUP 4 TUBE (SUCTIONS) IMPLANT
EXTRACTOR VACUUM M CUP 4' TUBE (SUCTIONS)
GLOVE BIOGEL PI IND STRL 6.5 (GLOVE) ×1 IMPLANT
GLOVE BIOGEL PI IND STRL 7.0 (GLOVE) ×1 IMPLANT
GLOVE BIOGEL PI INDICATOR 6.5 (GLOVE) ×2
GLOVE BIOGEL PI INDICATOR 7.0 (GLOVE) ×2
GLOVE SURG SS PI 6.0 STRL IVOR (GLOVE) ×3 IMPLANT
GOWN STRL REUS W/TWL LRG LVL3 (GOWN DISPOSABLE) ×6 IMPLANT
KIT ABG SYR 3ML LUER SLIP (SYRINGE) IMPLANT
NDL HYPO 25X5/8 SAFETYGLIDE (NEEDLE) IMPLANT
NEEDLE HYPO 25X5/8 SAFETYGLIDE (NEEDLE) IMPLANT
NS IRRIG 1000ML POUR BTL (IV SOLUTION) ×3 IMPLANT
PACK C SECTION WH (CUSTOM PROCEDURE TRAY) ×3 IMPLANT
PAD OB MATERNITY 4.3X12.25 (PERSONAL CARE ITEMS) ×3 IMPLANT
PENCIL SMOKE EVAC W/HOLSTER (ELECTROSURGICAL) ×3 IMPLANT
RTRCTR C-SECT PINK 25CM LRG (MISCELLANEOUS) IMPLANT
SEPRAFILM MEMBRANE 5X6 (MISCELLANEOUS) IMPLANT
STRIP CLOSURE SKIN 1/2X4 (GAUZE/BANDAGES/DRESSINGS) ×1 IMPLANT
SUT PLAIN 0 NONE (SUTURE) IMPLANT
SUT VIC AB 0 CT1 36 (SUTURE) ×12 IMPLANT
SUT VIC AB 4-0 KS 27 (SUTURE) ×3 IMPLANT
TOWEL OR 17X24 6PK STRL BLUE (TOWEL DISPOSABLE) ×3 IMPLANT
TRAY FOLEY BAG SILVER LF 14FR (SET/KITS/TRAYS/PACK) ×3 IMPLANT

## 2017-03-31 NOTE — Consult Note (Signed)
Neonatology Note:   Attendance at C-section:    Our team responded to a Code Caeserean from MAU, called by Dr. Chestine Sporelark, to attend this stat C-section under general anesthesia at 34 6/7 weeks due to fetal bradycardia. The mother is a G3P0A2 A pos, GBS not done yet; pregnancy complicated by gestational hypertension and onset of abdominal pain tonight, leading her to come to MAU. FHR was in the 60s, rising to the 80s upon arrival in the OR. A Code Caesarean was called. ROM at delivery, fluid clear. Infant was flaccid, pale, and apneic at delivery, so cord was clamped and cut immediately. We did bulb suctioning, then I applied PPV. The infant had an occasional gasp, but no sustained respiratory effort. Color improved and HR rose above 100 quickly; baby was opening his eyes and coughed once in a while. We continued PPV, stopping every minute to check for spontaneous respiratory effort. At about 3 minutes, the baby began to have decreasing HR despite PPV, so I intubated him at 4 minutes. Used a 3.0 mm ETT, inserted on the first attempt, atraumatically, to a depth of 10 cm at the lips. Equal breath sounds were heard and the CO2 detector turned yellow right away. HR remained about 120-130 after that and color improved. Ap 2/5/7. Cord pH 6.94. Obstetrician stated that there was a placental abruption seen at delivery. We transported the baby to NICU for further care, intubated, being bagged using the neopuff.   Doretha Souhristie C. Quaniyah Bugh, MD

## 2017-03-31 NOTE — Anesthesia Procedure Notes (Signed)
Procedure Name: Intubation Date/Time: 03/31/2017 9:00 PM Performed by: Leonides GrillsEllender, Ryan P, MD Pre-anesthesia Checklist: Patient identified, Suction available and Patient being monitored Patient Re-evaluated:Patient Re-evaluated prior to induction Oxygen Delivery Method: Circle system utilized Preoxygenation: Pre-oxygenation with 100% oxygen Induction Type: IV induction, Rapid sequence and Cricoid Pressure applied Laryngoscope Size: Glidescope and 3 Grade View: Grade I Tube type: Oral Tube size: 7.0 mm Number of attempts: 1 Airway Equipment and Method: Video-laryngoscopy Placement Confirmation: ETT inserted through vocal cords under direct vision and positive ETCO2 Secured at: 22 cm Tube secured with: Tape Dental Injury: Teeth and Oropharynx as per pre-operative assessment

## 2017-03-31 NOTE — OB Triage Provider Note (Signed)
Np called to the room by RN stating they were unable to doppler fetal heart tones via external monitor. Bedside US applied by NP, ventricles of the heart visualized with cardiac activity that appeared to be bradycardiac. Any OB in house called to MAU room 7. Fetal bradycardia confirmed by Dr. Chestine Sporelark at bedside, Stat Cesarean section called. Patient to the OR. Patient reports abdominal pain and decreased urine output.   Vitals:   03/31/17 2044  BP: (!) 172/105  Pulse: 72  SpO2: 100%     Kade Demicco, Harolyn RutherfordJennifer I, NP 03/31/2017 8:59 PM

## 2017-03-31 NOTE — MAU Note (Signed)
Pt reports sudden onset of constant lower pelvic pain after urinating around 7:30p. Pt reports burning with urination and peeing only small amounts over the last day or two. Pt denies bleeding or LOF. Reports good fetal movement.

## 2017-03-31 NOTE — Progress Notes (Signed)
I was called urgently to MAU to evaluate a potential fetal bradycardia.  Upon my arrival, J Rasch, NP had the US on the patient's abdomen with FHR noted in the 60-70s.  Per her report, patient presented to MAU with c/o abdominal pain.  G1 @ 34+ weeks, pregnancy c/b diagnosis of ghtn 2 weeks ago.  Not on any anti-hypertensive meds.  On arrival in MAU BP was 172/105.  She denied vaginal bleeding.  Nurses were unable to auscultate fetal hearts via external monitor.  Upon my arrival, fetal heart tones were low for at least 6-8 minutes.  I confirmed fetal bradycardia by ultrasound and elicited the above history. I was concerned for placental abruption in the setting of severe preeclampsia.  The patient was consented for emergent cesarean delivery and a stat cesarean section was called.   In the OR, general anesthesia was induced.  After delivery of the fetus, the placenta delivered immediately and spontaneously.  Behind the placenta was approximately 200 cc clot, consistent with placental abruption.  After induction of anesthesia, BP was noted to still be in the severe range.  20mg  IV labetalol ordered, but BP decreased to 130s/90s prior to administration, so IV medication held.  At this time, Drs. Doroteo GlassmanPhelps and [place] PointDavis scrubbed to assume care.  Please see their operative report for full details.

## 2017-03-31 NOTE — Transfer of Care (Signed)
Immediate Anesthesia Transfer of Care Note  Patient: Jamie Bautista  Procedure(s) Performed: CESAREAN SECTION (N/A )  Patient Location: PACU  Anesthesia Type:General  Level of Consciousness: awake, alert  and oriented  Airway & Oxygen Therapy: Patient Spontanous Breathing and Patient connected to nasal cannula oxygen  Post-op Assessment: Report given to RN, Post -op Vital signs reviewed and stable and Post -op Vital signs reviewed and unstable, Anesthesiologist notified  Post vital signs: Reviewed and unstable  Last Vitals:  Vitals:   03/31/17 2044  BP: (!) 172/105  Pulse: 72  SpO2: 100%    Last Pain:  Vitals:   03/31/17 2044  PainSc: 7          Complications: No apparent anesthesia complications

## 2017-03-31 NOTE — MAU Note (Signed)
PT SAYS SHE STARTED HAVING LOWER ABD PAIN  AT 730PM AFTER SHE VOIDED . .Marland Kitchen

## 2017-03-31 NOTE — Anesthesia Preprocedure Evaluation (Signed)
Anesthesia Evaluation  Patient identified by MRN, date of birth, ID band Patient awake  Preop documentation limited or incomplete due to emergent nature of procedure.  Airway Mallampati: II  TM Distance: >3 FB Neck ROM: Full    Dental no notable dental hx.    Pulmonary former smoker,    Pulmonary exam normal        Cardiovascular Normal cardiovascular exam     Neuro/Psych    GI/Hepatic   Endo/Other    Renal/GU      Musculoskeletal   Abdominal (+) + obese,   Peds  Hematology   Anesthesia Other Findings Code Cesarean  Reproductive/Obstetrics                             Anesthesia Physical Anesthesia Plan  ASA: II and emergent  Anesthesia Plan: General   Post-op Pain Management:    Induction: Intravenous and Rapid sequence  PONV Risk Score and Plan: 3 and Treatment may vary due to age or medical condition, Ondansetron and Midazolam  Airway Management Planned: Oral ETT  Additional Equipment:   Intra-op Plan:   Post-operative Plan: Extubation in OR  Informed Consent:   Plan Discussed with: CRNA  Anesthesia Plan Comments:         Anesthesia Quick Evaluation

## 2017-03-31 NOTE — H&P (Signed)
Obstetric History and Physical  Jamie Bautista is a 22 y.o. G3P0020 with IUP at [redacted]w[redacted]d who arrived to the MAU with initial complaints of constant lower abdominal pain after using the bathroom around 1830. She denied vaginal bleeding or LOF at that time. MAU provider wwas called to room due to inability to get [facility] during triage. Bedside US also confirmed fetal bradycardia. Faculty practice attending was in a surgery so private arrived to room and confirmed bradycardia. Dr. Chestine Spore called STAT c-section.   Prenatal Course Source of Care: [facility] Dating: By [place] --->  Estimated Date of Delivery: 05/06/17 Pregnancy complications or risks: Patient Active Problem List   Diagnosis Date Noted  . Status post primary low transverse cesarean section 03/31/2017  . Gestational hypertension 03/19/2017  . Supervision of high risk pregnancy, antepartum 03/19/2017  . Abnormal findings on antenatal screening    Sono:    @[redacted]w[redacted]d , CWD, normal anatomy, AFI wnl, cephalic presentation, anterior placenta, 2079g, 54% EFW  Prenatal labs and studies: ABO, Rh: --/--/A POS (01/21 2050) Antibody: NEG (01/21 2050) Rubella:  IMMUNE RPR: Non Reactive (06/26 1740)  HBsAg:   NEGATIVE HIV: Non Reactive (06/26 1740)  GBS: UNKNOWN 1 hr Glucola  Normal (113) Genetic screening abnormal with quad screen with elevated risk of T21 Anatomy US normal  Prenatal Transfer Tool  Maternal Diabetes: No Genetic Screening: Abnormal:  Results: Elevated risk of Trisomy 21 Maternal Ultrasounds/Referrals: Normal Fetal Ultrasounds or other Referrals:  None Maternal Substance Abuse:  No Significant Maternal Medications:  None Significant Maternal Lab Results: None  Past Medical History:  Diagnosis Date  . Medical history non-contributory     Past Surgical History:  Procedure Laterality Date  . NO PAST SURGERIES      OB History  Gravida Para Term Preterm AB Living  3 0 0 0 2 0  SAB TAB Ectopic Multiple Live Births  1 1 0 0 0     # Outcome Date GA Lbr Len/2nd Weight Sex Delivery Anes PTL Lv  3 Current           2 TAB           1 SAB               Social History   Socioeconomic History  . Marital status: Single    Spouse name: Not on file  . Number of children: Not on file  . Years of education: Not on file  . Highest education level: Not on file  Social Needs  . Financial resource strain: Not on file  . Food insecurity - worry: Not on file  . Food insecurity - inability: Not on file  . Transportation needs - medical: Not on file  . Transportation needs - non-medical: Not on file  Occupational History  . Not on file  Tobacco Use  . Smoking status: Former Smoker    Packs/day: 0.50    Types: Cigarettes    Last attempt to quit: 10/27/2016    Years since quitting: 0.4  . Smokeless tobacco: Never Used  Substance and Sexual Activity  . Alcohol use: No  . Drug use: Yes    Types: Marijuana    Comment: none in pregnancy  . Sexual activity: Not Currently    Birth control/protection: None  Other Topics Concern  . Not on file  Social History Narrative  . Not on file    No family history on file.  Medications Prior to Admission  Medication Sig Dispense Refill Last Dose  .  Prenatal Vit-Fe Fumarate-FA (PRENATAL VITAMIN PO) Take by mouth.   Taking    No Known Allergies  Review of Systems: Negative except for what is mentioned in HPI.  Physical Exam:  BP (!) 172/105   Pulse 72   Ht 5' 2.5" (1.588 m)   Wt 88.9 kg (196 lb)   LMP 06/22/2016 (Approximate)   SpO2 100%   BMI 35.28 kg/m  CONSTITUTIONAL: Well-developed, well-nourished female in no acute distress.  HENT:  Normocephalic, atraumatic, External right and left ear normal. Oropharynx is clear and moist EYES: Conjunctivae and EOM are normal. Pupils are equal, round, and reactive to light. No scleral icterus.  NECK: Normal range of motion, supple, no masses SKIN: Skin is warm and dry. No rash noted. Not diaphoretic. No erythema. No  pallor. NEUROLOGIC: Alert and oriented to person, place, and time. Normal reflexes, muscle tone coordination. No cranial nerve deficit noted. PSYCHIATRIC: Normal mood and affect. Normal behavior. Normal judgment and thought content. CARDIOVASCULAR: Normal heart rate noted, regular rhythm RESPIRATORY: Effort and breath sounds normal, no problems with respiration noted ABDOMEN: Soft, nondistended, gravid. MUSCULOSKELETAL: Normal range of motion. No edema and no tenderness. 2+ distal pulses.  Cervical Exam: Deferred FHT:  60-70 bpm in MAU    Pertinent Labs/Studies:   Results for orders placed or performed during the hospital encounter of 03/31/17 (from the past 24 hour(s))  Urinalysis, Routine w reflex microscopic     Status: Abnormal   Collection Time: 03/31/17  8:33 PM  Result Value Ref Range   Color, Urine STRAW (A) YELLOW   APPearance CLEAR CLEAR   Specific Gravity, Urine 1.009 1.005 - 1.030   pH 7.0 5.0 - 8.0   Glucose, UA NEGATIVE NEGATIVE mg/dL   Hgb urine dipstick NEGATIVE NEGATIVE   Bilirubin Urine NEGATIVE NEGATIVE   Ketones, ur NEGATIVE NEGATIVE mg/dL   Protein, ur >=161 (A) NEGATIVE mg/dL   Nitrite NEGATIVE NEGATIVE   Leukocytes, UA NEGATIVE NEGATIVE   RBC / HPF 0-5 0 - 5 RBC/hpf   WBC, UA 0-5 0 - 5 WBC/hpf   Bacteria, UA RARE (A) NONE SEEN   Squamous Epithelial / LPF 0-5 (A) NONE SEEN   Mucus PRESENT   Protein / creatinine ratio, urine     Status: Abnormal   Collection Time: 03/31/17  8:33 PM  Result Value Ref Range   Creatinine, Urine 47.00 mg/dL   Total Protein, Urine 170 mg/dL   Protein Creatinine Ratio 3.62 (H) 0.00 - 0.15 mg/mg[Cre]  CBC     Status: Abnormal   Collection Time: 03/31/17  8:50 PM  Result Value Ref Range   WBC 9.3 4.0 - 10.5 K/uL   RBC 4.08 3.87 - 5.11 MIL/uL   Hemoglobin 12.1 12.0 - 15.0 g/dL   HCT 09.6 04.5 - 40.9 %   MCV 89.0 78.0 - 100.0 fL   MCH 29.7 26.0 - 34.0 pg   MCHC 33.3 30.0 - 36.0 g/dL   RDW 81.1 91.4 - 78.2 %   Platelets 136  (L) 150 - 400 K/uL  Type and screen [facility]     Status: None   Collection Time: 03/31/17  8:50 PM  Result Value Ref Range   ABO/RH(D) A POS    Antibody Screen NEG    Sample Expiration 04/03/2017   Comprehensive metabolic panel     Status: Abnormal   Collection Time: 03/31/17  8:50 PM  Result Value Ref Range   Sodium 133 (L) 135 - 145 mmol/L  Potassium 4.1 3.5 - 5.1 mmol/L   Chloride 107 101 - 111 mmol/L   CO2 19 (L) 22 - 32 mmol/L   Glucose, Bld 103 (H) 65 - 99 mg/dL   BUN 11 6 - 20 mg/dL   Creatinine, Ser 1.610.87 0.44 - 1.00 mg/dL   Calcium 8.8 (L) 8.9 - 10.3 mg/dL   Total Protein 6.8 6.5 - 8.1 g/dL   Albumin 2.9 (L) 3.5 - 5.0 g/dL   AST 24 15 - 41 U/L   ALT 11 (L) 14 - 54 U/L   Alkaline Phosphatase 182 (H) 38 - 126 U/L   Total Bilirubin 0.2 (L) 0.3 - 1.2 mg/dL   GFR calc non Af Amer >60 >60 mL/min   GFR calc Af Amer >60 >60 mL/min   Anion gap 7 5 - 15  Uric acid     Status: None   Collection Time: 03/31/17  8:50 PM  Result Value Ref Range   Uric Acid, Serum 4.8 2.3 - 6.6 mg/dL  Lactate dehydrogenase     Status: None   Collection Time: 03/31/17  8:50 PM  Result Value Ref Range   LDH 176 98 - 192 U/L    Assessment : Jamie Bautista is a 22 y.o. G3P0020 at 6060w6d who is s/p STAT pLTCS for fetal bradycardia. Noted to have preeclampsia with elevated BPs on admission. Prenatal history complicated by increased risk of T21 and gHTN.    Plan: Admit to postpartum Preeclampsia: Start 24/hrs of magnesium. PIH labs abnormal. Labetalol protocol prn. Routine postpartum care Infant in NICU   Caryl AdaJazma Kase Shughart, DO OB Fellow Faculty Practice, [facility] 03/31/2017, 10:25 PM

## 2017-03-31 NOTE — MAU Note (Signed)
PT  SAYS  AT 730PM- WENT  TO B-ROOM - AFTER VOIDING -  SHE STARTED HAVING LOWER ABD PAIN

## 2017-03-31 NOTE — Op Note (Signed)
Bethann Berkshireierra J Zynda PROCEDURE DATE: 03/31/2017  PREOPERATIVE DIAGNOSES: Intrauterine pregnancy at 8870w6d weeks gestation; non-reassuring fetal status(fetal bradycardia), gHTN(elevated pressures on admission), ?abruption  POSTOPERATIVE DIAGNOSES: The same with placental abruption  PROCEDURE: Primary Low Transverse Cesarean Section  SURGEON:  Dr. Carmelina Daneyanna Clark(delivered infant), Dr. Earlene Plateravis (closed)  ASSISTANT:  Dr. [name]  ANESTHESIOLOGY TEAM: Anesthesiologist: Leonides GrillsEllender, Ryan P, MD CRNA: Armanda HeritageSterling, Charlesetta M, CRNA  INDICATIONS: Bethann BerkshireCierra J Fishman is a 22 y.o. G3P0020 at 2470w6d here for STAT cesarean section secondary to persistent fetal bradycardia. Verbal consent given by mother. The need for STAT c-section discussed with mother. The patient concurred with the proposed plan, giving informed verbal consent for the procedure.    FINDINGS:  Viable female infant in cephalic presentation.  Apgars 2, 5, and 7.  Clear amniotic fluid.  Detached placenta with large clots, three vessel cord.  Normal uterus, fallopian tubes and ovaries bilaterally.  ANESTHESIA: General INTRAVENOUS FLUIDS: 1800 ml   ESTIMATED BLOOD LOSS: 871 ml URINE OUTPUT:  75 ml SPECIMENS: Placenta sent to pathology COMPLICATIONS: None immediate  PROCEDURE IN DETAIL:  The patient received intravenous antibiotics while in the OR and had sequential compression devices applied to her lower extremities.  She was placed under general anesthesia. She was then positioned in a dorsal supine position with a leftward tilt, and betadine applied urgently.  A foley catheter was placed into her bladder and attached to constant gravity.  A Pfannenstiel skin incision was made with scalpel and carried through to the underlying layer of fascia. The fascia was separated bluntly. The underlying rectus muscles were dissected off bluntly. The rectus muscles were separated in the midline bluntly and the peritoneum was entered bluntly. Attention was turned to the  lower uterine segment where a low transverse hysterotomy was made with a scalpel and extended bilaterally bluntly.  The infant was successfully delivered, the cord was clamped and cut, and the infant was handed over to the awaiting neonatology team. The placenta was detached upon inspection with large clots in the uterus. Placenta delivered intact with a three-vessel cord. The uterus was then cleared of clots and debris. Alexis retractor placed.  The hysterotomy was closed with Monocryl in a running locked fashion, and an imbricating layer was also placed.  Figure-of-eight Monocryl serosal stitch was placed to help with hemostasis.  The pelvis was cleared of all clot and debris. Hemostasis was confirmed on all surfaces.  The peritoneum was closed with a 0 Vicryl running stitch. The fascia was then closed using 0 Vicryl in a running fashion.  The subcutaneous layer was irrigated, then reapproximated with 2-0 plain gut interrupted stitches.  The skin was closed with a 4-0 Monocryl subcuticular stitch. The patient tolerated the procedure well. Sponge, lap, instrument and needle counts were correct x 3.  She was taken to the recovery room in stable condition.   [name], DO OB Fellow

## 2017-03-31 NOTE — Anesthesia Procedure Notes (Signed)
Procedure Name: Intubation Date/Time: 03/31/2017 8:58 PM Performed by: Armanda HeritageSterling, Sheriden Archibeque M, CRNA Pre-anesthesia Checklist: Patient identified, Timeout performed, Emergency Drugs available, Suction available and Patient being monitored Oxygen Delivery Method: Circle system utilized Preoxygenation: Pre-oxygenation with 100% oxygen Induction Type: IV induction and Rapid sequence Tube size: 7.0 mm Number of attempts: 1 Placement Confirmation: ETT inserted through vocal cords under direct vision and positive ETCO2 Secured at: 21 cm Tube secured with: Tape Dental Injury: Teeth and Oropharynx as per pre-operative assessment

## 2017-04-01 ENCOUNTER — Encounter (HOSPITAL_COMMUNITY): Payer: Self-pay | Admitting: *Deleted

## 2017-04-01 ENCOUNTER — Other Ambulatory Visit: Payer: Self-pay

## 2017-04-01 LAB — CBC
HCT: 34.5 % — ABNORMAL LOW (ref 36.0–46.0)
Hemoglobin: 11.5 g/dL — ABNORMAL LOW (ref 12.0–15.0)
MCH: 29.4 pg (ref 26.0–34.0)
MCHC: 33.3 g/dL (ref 30.0–36.0)
MCV: 88.2 fL (ref 78.0–100.0)
PLATELETS: 133 10*3/uL — AB (ref 150–400)
RBC: 3.91 MIL/uL (ref 3.87–5.11)
RDW: 14.2 % (ref 11.5–15.5)
WBC: 19.1 10*3/uL — AB (ref 4.0–10.5)

## 2017-04-01 LAB — COMPREHENSIVE METABOLIC PANEL
ALBUMIN: 2.5 g/dL — AB (ref 3.5–5.0)
ALT: 12 U/L — ABNORMAL LOW (ref 14–54)
AST: 32 U/L (ref 15–41)
Alkaline Phosphatase: 160 U/L — ABNORMAL HIGH (ref 38–126)
Anion gap: 9 (ref 5–15)
BUN: 9 mg/dL (ref 6–20)
CO2: 19 mmol/L — ABNORMAL LOW (ref 22–32)
Calcium: 7.9 mg/dL — ABNORMAL LOW (ref 8.9–10.3)
Chloride: 102 mmol/L (ref 101–111)
Creatinine, Ser: 0.82 mg/dL (ref 0.44–1.00)
GFR calc Af Amer: >60 mL/min (ref >60)
GLUCOSE: 165 mg/dL — AB (ref 65–99)
POTASSIUM: 3.9 mmol/L (ref 3.5–5.1)
Sodium: 130 mmol/L — ABNORMAL LOW (ref 135–145)
Total Bilirubin: 0.4 mg/dL (ref 0.3–1.2)
Total Protein: 6 g/dL — ABNORMAL LOW (ref 6.5–8.1)

## 2017-04-01 LAB — PROTEIN / CREATININE RATIO, URINE
CREATININE, URINE: 47 mg/dL
Protein Creatinine Ratio: 3.62 mg/mg{Cre} — ABNORMAL HIGH (ref 0.00–0.15)
Total Protein, Urine: 170 mg/dL

## 2017-04-01 LAB — ABO/RH: ABO/RH(D): A POS

## 2017-04-01 LAB — RPR: RPR Ser Ql: NONREACTIVE

## 2017-04-01 MED ORDER — DIPHENHYDRAMINE HCL 25 MG PO CAPS: 25.0000 mg | ORAL_CAPSULE | Freq: Four times a day (QID) | ORAL | Status: DC | PRN

## 2017-04-01 MED ORDER — SIMETHICONE 80 MG PO CHEW
80.0000 mg | CHEWABLE_TABLET | ORAL | Status: DC
  Filled 2017-04-01: qty 1

## 2017-04-01 MED ORDER — IBUPROFEN 600 MG PO TABS: 600.0000 mg | ORAL_TABLET | Freq: Four times a day (QID) | ORAL | Status: DC

## 2017-04-01 MED ORDER — ZOLPIDEM TARTRATE 5 MG PO TABS: 5.0000 mg | ORAL_TABLET | Freq: Every evening | ORAL | Status: DC | PRN

## 2017-04-01 MED ORDER — SIMETHICONE 80 MG PO CHEW: 80.0000 mg | CHEWABLE_TABLET | ORAL | Status: DC | PRN

## 2017-04-01 MED ORDER — SENNOSIDES-DOCUSATE SODIUM 8.6-50 MG PO TABS: 2.0000 | ORAL_TABLET | Freq: Every evening | ORAL | Status: DC | PRN

## 2017-04-01 MED ORDER — ACETAMINOPHEN 325 MG PO TABS
650.0000 mg | ORAL_TABLET | ORAL | Status: DC | PRN
  Administered 2017-04-03: 650 mg via ORAL
  Filled 2017-04-01: qty 2

## 2017-04-01 MED ORDER — OXYCODONE HCL 5 MG PO TABS
5.0000 mg | ORAL_TABLET | ORAL | Status: DC | PRN
  Administered 2017-04-01 – 2017-04-03 (×3): 5 mg via ORAL
  Filled 2017-04-01 (×3): qty 1

## 2017-04-01 MED ORDER — TETANUS-DIPHTH-ACELL PERTUSSIS 5-2.5-18.5 LF-MCG/0.5 IM SUSP: 0.5000 mL | Freq: Once | INTRAMUSCULAR | Status: DC

## 2017-04-01 MED ORDER — MENTHOL 3 MG MT LOZG: 1.0000 | LOZENGE | OROMUCOSAL | Status: DC | PRN

## 2017-04-01 MED ORDER — IBUPROFEN 600 MG PO TABS
600.0000 mg | ORAL_TABLET | Freq: Four times a day (QID) | ORAL | Status: DC | PRN
  Administered 2017-04-01 – 2017-04-03 (×3): 600 mg via ORAL
  Filled 2017-04-01 (×3): qty 1

## 2017-04-01 MED ORDER — KETOROLAC TROMETHAMINE 30 MG/ML IJ SOLN
30.0000 mg | Freq: Once | INTRAMUSCULAR | Status: AC
  Administered 2017-04-01: 30 mg via INTRAVENOUS
  Filled 2017-04-01: qty 1

## 2017-04-01 MED ORDER — NIFEDIPINE ER OSMOTIC RELEASE 30 MG PO TB24
30.0000 mg | ORAL_TABLET | Freq: Every day | ORAL | Status: DC
  Administered 2017-04-01 – 2017-04-03 (×3): 30 mg via ORAL
  Filled 2017-04-01 (×3): qty 1

## 2017-04-01 MED ORDER — OXYTOCIN 40 UNITS IN LACTATED RINGERS INFUSION - SIMPLE MED: 2.5000 [IU]/h | INTRAVENOUS | Status: AC

## 2017-04-01 MED ORDER — SENNOSIDES-DOCUSATE SODIUM 8.6-50 MG PO TABS: 2.0000 | ORAL_TABLET | ORAL | Status: DC

## 2017-04-01 MED ORDER — WITCH HAZEL-GLYCERIN EX PADS: 1.0000 "application " | MEDICATED_PAD | CUTANEOUS | Status: DC | PRN

## 2017-04-01 MED ORDER — POLYETHYLENE GLYCOL 3350 17 G PO PACK
17.0000 g | PACK | Freq: Every day | ORAL | Status: DC
  Administered 2017-04-01 – 2017-04-02 (×2): 17 g via ORAL
  Filled 2017-04-01 (×2): qty 1

## 2017-04-01 MED ORDER — LACTATED RINGERS IV SOLN
INTRAVENOUS | Status: DC
  Administered 2017-04-01: 22:00:00 via INTRAVENOUS

## 2017-04-01 MED ORDER — DIBUCAINE 1 % RE OINT: 1.0000 "application " | TOPICAL_OINTMENT | RECTAL | Status: DC | PRN

## 2017-04-01 MED ORDER — SIMETHICONE 80 MG PO CHEW
80.0000 mg | CHEWABLE_TABLET | Freq: Three times a day (TID) | ORAL | Status: DC
  Administered 2017-04-01 – 2017-04-03 (×6): 80 mg via ORAL
  Filled 2017-04-01 (×6): qty 1

## 2017-04-01 MED ORDER — OXYCODONE HCL 5 MG PO TABS
10.0000 mg | ORAL_TABLET | ORAL | Status: DC | PRN
  Administered 2017-04-01 – 2017-04-02 (×2): 10 mg via ORAL
  Filled 2017-04-01 (×2): qty 2

## 2017-04-01 MED ORDER — COCONUT OIL OIL
1.0000 "application " | TOPICAL_OIL | Status: DC | PRN
  Administered 2017-04-03: 1 via TOPICAL
  Filled 2017-04-01: qty 120

## 2017-04-01 MED ORDER — PRENATAL MULTIVITAMIN CH
1.0000 | ORAL_TABLET | Freq: Every day | ORAL | Status: DC
  Administered 2017-04-01 – 2017-04-02 (×2): 1 via ORAL
  Filled 2017-04-01 (×2): qty 1

## 2017-04-01 NOTE — Anesthesia Postprocedure Evaluation (Signed)
Anesthesia Post Note  Patient: Jamie Bautista  Procedure(s) Performed: CESAREAN SECTION (N/A )     Patient location during evaluation: [facility] Anesthesia Type: General Level of consciousness: awake Pain management: satisfactory to patient Vital Signs Assessment: post-procedure vital signs reviewed and stable Respiratory status: spontaneous breathing Cardiovascular status: stable Anesthetic complications: no    Last Vitals:  Vitals:   04/01/17 0630 04/01/17 0743  BP: 139/86 (!) 154/93  Pulse: 84 77  Resp: 18 18  Temp:  37.1 C  SpO2: 99% 100%    Last Pain:  Vitals:   04/01/17 0743  TempSrc: Oral  PainSc:    Pain Goal: Patients Stated Pain Goal: 3 (04/01/17 0730)               Cephus ShellingBURGER,Osamu Olguin

## 2017-04-01 NOTE — Progress Notes (Addendum)
Subjective: Postpartum Day 1: Cesarean Delivery Patient reports that she is doing well. Pain is controlled. She is ambulating. Denies nausea, vomiting. Taking PO. Denies PIH symptoms.   Objective: Vital signs in last 24 hours: Temp:  [97.4 F (36.3 C)-98.6 F (37 C)] 98.5 F (36.9 C) (01/22 0528) Pulse Rate:  [64-91] 84 (01/22 0630) Resp:  [12-22] 18 (01/22 0630) BP: (139-183)/(84-109) 139/86 (01/22 0630) SpO2:  [95 %-100 %] 99 % (01/22 0630) Weight:  [88.9 kg (196 lb)] 88.9 kg (196 lb) (01/21 2039)  Physical Exam:  General: alert, cooperative and no distress Lochia: appropriate Uterine Fundus: firm Incision: no significant drainage DVT Evaluation: No evidence of DVT seen on physical exam.  Recent Labs    03/31/17 2050 04/01/17 0550  HGB 12.1 11.5*  HCT 36.3 34.5*    Assessment/Plan: Status post Cesarean section. Doing well postoperatively.  Continue current care Continue Mag for 24hrs for preeclampsia Monitor BPs  Jamie AdaJazma Elisandra Deshmukh, DO 04/01/2017, 7:37 AM

## 2017-04-01 NOTE — Progress Notes (Signed)
Postpartum Interval Note  Admission Date: 03/31/2017 Current Date: 04/01/2017 2:54 PM  Jamie Bautista is a 22 y.o. [id number] POD#1 s/p stat pLTCS for NRFHT and abruption in the MAU at 34/6wks. EBL  Pregnancy complicated by: gHTN-->dx with severe pre-eclampsia on admission (BP criteria) Overnight/24hr events:  Started on procardia 30mg  xl this morning.   Subjective:  Doing well. No s/s of pre-x or Mg toxicity. Meeting all post op goals except no flatus yet.   Objective:    Current Vital Signs 24h Vital Sign Ranges  T 98.5 F (36.9 C) Temp  Avg: 98.2 F (36.8 C)  Min: 97.4 F (36.3 C)  Max: 98.7 F (37.1 C)  BP (!) 158/95 BP  Min: 139/86  Max: 183/105  HR 81 Pulse  Avg: 75.4  Min: 64  Max: 91  RR 18 Resp  Avg: 16.8  Min: 12  Max: 22  SaO2 98 % Not Delivered SpO2  Avg: 99.2 %  Min: 95 %  Max: 100 %       24 Hour I/O Current Shift I/O  Time Ins Outs 01/21 0701 - 01/22 0700 In: 3874.4 [P.O.:530; I.V.:3344.4] Out: 4471 [Urine:3600] 01/22 0701 - 01/22 1900 In: 605 [P.O.:480; I.V.:125] Out: 750 [Urine:750]   Patient Vitals for the past 24 hrs:  BP Temp Temp src Pulse Resp SpO2 Height Weight  04/01/17 1148 (!) 158/95 98.5 F (36.9 C) Oral 81 18 98 % - -  04/01/17 0743 (!) 154/93 98.7 F (37.1 C) Oral 77 18 100 % - -  04/01/17 0630 139/86 - - 84 18 99 % - -  04/01/17 0528 (!) 146/84 98.5 F (36.9 C) Oral 91 15 99 % - -  04/01/17 0430 139/84 - - 81 16 99 % - -  04/01/17 0330 (!) 149/90 - - 73 15 98 % - -  04/01/17 0230 (!) 150/89 - - 72 16 100 % - -  04/01/17 0130 (!) 151/91 98 F (36.7 C) Oral 73 18 100 % - -  04/01/17 0034 (!) 150/93 98.6 F (37 C) Oral 72 15 98 % - -  04/01/17 0000 (!) 154/98 98.4 F (36.9 C) Oral 81 19 97 % - -  03/31/17 2350 - - - 82 13 98 % - -  03/31/17 2345 (!) 160/99 - - 72 (!) 21 95 % - -  03/31/17 2340 (!) 160/102 - - 69 (!) 22 98 % - -  03/31/17 2335 (!) 167/102 - - 80 18 100 % - -  03/31/17 2330 (!) 167/102 98.4 F (36.9 C) Oral 82 20  100 % - -  03/31/17 2325 - - - 73 20 100 % - -  03/31/17 2320 - - - 76 19 100 % - -  03/31/17 2315 (!) 153/97 - - 74 19 99 % - -  03/31/17 2310 - - - 77 16 98 % - -  03/31/17 2305 (!) 155/88 - - 76 16 98 % - -  03/31/17 2301 - 97.6 F (36.4 C) Oral 74 - 100 % - -  03/31/17 2300 (!) 150/93 - - 78 16 100 % - -  03/31/17 2255 (!) 156/97 - - 86 18 100 % - -  03/31/17 2250 (!) 157/97 - - 73 16 100 % - -  03/31/17 2245 (!) 149/94 98.4 F (36.9 C) Oral 76 14 100 % - -  03/31/17 2240 (!) 153/95 - - 64 16 100 % - -  03/31/17 2235 (!) 175/96 - -  66 13 100 % - -  03/31/17 2230 (!) 176/109 (!) 97.4 F (36.3 C) Oral 68 12 100 % - -  03/31/17 2220 - - - 69 12 100 % - -  03/31/17 2215 - - - 73 17 100 % - -  03/31/17 2212 - - - 71 16 100 % - -  03/31/17 2210 (!) 183/105 - - 73 17 99 % - -  03/31/17 2208 (!) 181/106 (!) 97.4 F (36.3 C) Oral 73 17 99 % - -  03/31/17 2044 (!) 172/105 - - 72 - 100 % - -  03/31/17 2039 - - - - - - 5' 2.5" (1.588 m) 196 lb (88.9 kg)  UOP: >16200mL/hr  Physical exam: General: Well nourished, well developed female in no acute distress. Abdomen: soft, nttp, nd Cardiovascular: S1, S2 normal, no murmur, rub or gallop, regular rate and rhythm Respiratory: CTAB Extremities: no clubbing, cyanosis or edema Skin: Warm and dry.   Medications: Current Facility-Administered Medications  Medication Dose Route Frequency Provider Last Rate Last Dose  . acetaminophen (TYLENOL) tablet 650 mg  650 mg Oral Q4H PRN Caryl AdaPhelps, Jazma Y, DO      . coconut oil  1 application Topical PRN Pincus LargePhelps, Jazma Y, DO      . witch hazel-glycerin (TUCKS) pad 1 application  1 application Topical PRN Pincus LargePhelps, Jazma Y, DO       And  . dibucaine (NUPERCAINAL) 1 % rectal ointment 1 application  1 application Rectal PRN Pincus LargePhelps, Jazma Y, DO      . diphenhydrAMINE (BENADRYL) capsule 25 mg  25 mg Oral Q6H PRN Caryl AdaPhelps, Jazma Y, DO      . ibuprofen (ADVIL,MOTRIN) tablet 600 mg  600 mg Oral Q6H PRN [place] BingPickens, Anubis Fundora,  MD   600 mg at 04/01/17 1214  . labetalol (NORMODYNE,TRANDATE) injection 20-80 mg  20-80 mg Intravenous Q10 min PRN Caryl AdaPhelps, Jazma Y, DO      . lactated ringers infusion   Intravenous Continuous [place] BingPickens, Anysia Choi, MD 100 mL/hr at 04/01/17 0554 100 mL/hr at 04/01/17 0554  . magnesium sulfate 40 grams in LR 500 mL OB infusion  2 g/hr Intravenous Continuous Pincus Largehelps, Jazma Y, DO 25 mL/hr at 04/01/17 0630 2 g/hr at 04/01/17 0630  . menthol-cetylpyridinium (CEPACOL) lozenge 3 mg  1 lozenge Oral Q2H PRN Caryl AdaPhelps, Jazma Y, DO      . NIFEdipine (PROCARDIA-XL/ADALAT-CC/NIFEDICAL-XL) 24 hr tablet 30 mg  30 mg Oral Daily [place] BingPickens, Stormy Sabol, MD   30 mg at 04/01/17 0935  . oxyCODONE (Oxy IR/ROXICODONE) immediate release tablet 10 mg  10 mg Oral Q4H PRN Caryl AdaPhelps, Jazma Y, DO   10 mg at 04/01/17 1214  . oxyCODONE (Oxy IR/ROXICODONE) immediate release tablet 5 mg  5 mg Oral Q4H PRN Caryl AdaPhelps, Jazma Y, DO      . oxytocin (PITOCIN) IV infusion 40 units in LR 1000 mL - Premix  2.5 Units/hr Intravenous Continuous Phelps, Jazma Y, DO      . polyethylene glycol (MIRALAX / GLYCOLAX) packet 17 g  17 g Oral Daily [place] BingPickens, Sasan Wilkie, MD   17 g at 04/01/17 0936  . prenatal multivitamin tablet 1 tablet  1 tablet Oral Q1200 Pincus Largehelps, Jazma Y, DO   1 tablet at 04/01/17 1214  . senna-docusate (Senokot-S) tablet 2 tablet  2 tablet Oral QHS PRN [place] BingPickens, Mitra Duling, MD      . simethicone [facility](MYLICON) chewable tablet 80 mg  80 mg Oral TID PC Phelps, Jazma Y, DO   80 mg at 04/01/17  1214  . Tdap (BOOSTRIX) injection 0.5 mL  0.5 mL Intramuscular Once [place] Glassman, Jazma Y, DO      . zolpidem (AMBIEN) tablet 5 mg  5 mg Oral QHS PRN Pincus Large, DO        Labs:  Recent Labs  Lab 03/31/17 2050 04/01/17 0550  WBC 9.3 19.1*  HGB 12.1 11.5*  HCT 36.3 34.5*  PLT 136* 133*    Recent Labs  Lab 03/31/17 2050 04/01/17 0551  NA 133* 130*  K 4.1 3.9  CL 107 102  CO2 19* 19*  BUN 11 9  CREATININE 0.87 0.82  CALCIUM 8.8* 7.9*  PROT 6.8 6.0*  BILITOT  0.2* 0.4  ALKPHOS 182* 160*  ALT 11* 12*  AST 24 32  GLUCOSE 103* 165*    Radiology: no new imaging.   Assessment & Plan:  Pt doing well *PP: routine care. Pumping. Newborn doing well in the NICU *Severe pre-x: continue Mg until 2100 tonight for 24hrs of coverage. Continue procardia. Continue to follow BPs closely. 1wk bp check request sent to clinic *PPx: scds, oob ad lib *FEN/GI: regular diet *Dispo: likely pod#3.   Cornelia Copa MD Attending Center for [facility])

## 2017-04-01 NOTE — Addendum Note (Signed)
Addendum  created 04/01/17 0825 by Algis GreenhouseBurger, Aubery Douthat A, CRNA   Sign clinical note

## 2017-04-01 NOTE — Anesthesia Postprocedure Evaluation (Signed)
Anesthesia Post Note  Patient: Jamie Bautista  Procedure(s) Performed: CESAREAN SECTION (N/A )     Patient location during evaluation: PACU Anesthesia Type: General Level of consciousness: awake and alert Pain management: pain level controlled Vital Signs Assessment: post-procedure vital signs reviewed and stable Respiratory status: spontaneous breathing, nonlabored ventilation, respiratory function stable and patient connected to nasal cannula oxygen Cardiovascular status: blood pressure returned to baseline and stable Postop Assessment: no apparent nausea or vomiting Anesthetic complications: no    Last Vitals:  Vitals:   04/01/17 0528 04/01/17 0630  BP: (!) 146/84 139/86  Pulse: 91 84  Resp: 15 18  Temp: 36.9 C   SpO2: 99% 99%    Last Pain:  Vitals:   04/01/17 0630  TempSrc:   PainSc: 0-No pain   Pain Goal: Patients Stated Pain Goal: 3 (04/01/17 0630)               Jamie Bautista

## 2017-04-02 ENCOUNTER — Encounter: Payer: Medicaid Other | Admitting: Obstetrics & Gynecology

## 2017-04-02 ENCOUNTER — Other Ambulatory Visit: Payer: Medicaid Other

## 2017-04-02 NOTE — Lactation Note (Signed)
This note was copied from a Jamie Bautista's chart. Lactation Consultation Note  Patient Name: [name] WUJWJ'XToday's Date: 04/02/2017 Reason for consult: Initial assessment;NICU Jamie Bautista;Late-preterm 34-36.6wks;Infant < 6lbs;Primapara;1st time breastfeeding  Jamie LeisureBaby is 4441 hours old,  Jamie Bautista is in NICU , 4534- 6/7 weeks, Female , Apgars 2-5-7  Per mom has pumped with the DEBP 4-5 times in the last 24 hours.  LC reviewed the set up of the DEBP with mom and per mom the #24 flange is  Comfortable.  LC discussed Supply and demand and the importance of increasing pumping  Up to 8 x's in 24 hours and when the volume increases 20 ml x 3 consecutively   can switch to the maintenance mode as shown.  Per mom plans to pump, company in the room and did not mind them being there for  The pumping.  Per mom will be getting a DEBP , she thought was a Medela.  LC mentioned to mom if she needed help setting to just ask . Mother informed of post-discharge support and given phone number to the lactation department, including services for phone call assistance; out-patient appointments; and breastfeeding support group. List of other breastfeeding resources in the community given in the handout. Encouraged mother to call for problems or concerns related to breastfeeding.   Maternal Data Does the patient have breastfeeding experience prior to this delivery?: No  Feeding    LATCH Score                   Interventions Interventions: Breast feeding basics reviewed;DEBP  Lactation Tools Discussed/Used Tools: Pump Breast pump type: Double-Electric Breast Pump Pump Review: Setup, frequency, and cleaning(LC reviewed both modes with mom ) Initiated by:: LC reviewed / MAI    Consult Status Consult Status: Follow-up Date: 04/03/17 Follow-up type: In-patient    Jamie Bautista 04/02/2017, 2:53 PM

## 2017-04-02 NOTE — Progress Notes (Signed)
Postpartum Interval Note  Admission Date: 03/31/2017 Current Date: 04/02/2017 11:18 AM  Jamie Bautista is a 22 y.o. [id number] POD#2 s/p stat pLTCS for NRFHT and abruption in the MAU at 34/6wks. EBL 900mL  Pregnancy complicated by: gHTN-->dx with severe pre-eclampsia on admission (BP criteria) Overnight/24hr events:  Came off Mg at around 2100 yesterday  Subjective:  Meeting all postop goals including flatus. No s/s of pre-eclampsia  Objective:    Current Vital Signs 24h Vital Sign Ranges  T 98.7 F (37.1 C) Temp  Avg: 98.4 F (36.9 C)  Min: 97.9 F (36.6 C)  Max: 98.9 F (37.2 C)  BP (!) 143/95 BP  Min: 135/75  Max: 158/95  HR 82 Pulse  Avg: 90.3  Min: 81  Max: 98  RR 18 Resp  Avg: 18  Min: 16  Max: 20  SaO2 97 % Not Delivered SpO2  Avg: 98 %  Min: 97 %  Max: 99 %       24 Hour I/O Current Shift I/O  Time Ins Outs 01/22 0701 - 01/23 0700 In: 3517.1 [P.O.:1680; I.V.:1837.1] Out: 1750 [Urine:1750] No intake/output data recorded.   Patient Vitals for the past 24 hrs:  BP Temp Temp src Pulse Resp SpO2  04/02/17 0800 (!) 143/95 98.7 F (37.1 C) Oral 82 18 -  04/02/17 0601 140/89 98 F (36.7 C) Oral 93 20 97 %  04/01/17 2315 139/79 98.4 F (36.9 C) Oral 90 18 98 %  04/01/17 1952 135/75 97.9 F (36.6 C) Oral 98 16 98 %  04/01/17 1500 137/78 98.9 F (37.2 C) Oral 98 18 99 %  04/01/17 1148 (!) 158/95 98.5 F (36.9 C) Oral 81 18 98 %    Physical exam: General: Well nourished, well developed female in no acute distress. Abdomen: soft, nttp, nd. C/d/i honeycomb dressing.  Cardiovascular: S1, S2 normal, no murmur, rub or gallop, regular rate and rhythm Respiratory: CTAB Extremities: no clubbing, cyanosis or edema Skin: Warm and dry.   Medications: Current Facility-Administered Medications  Medication Dose Route Frequency Provider Last Rate Last Dose  . acetaminophen (TYLENOL) tablet 650 mg  650 mg Oral Q4H PRN Caryl AdaPhelps, Jazma Y, DO      . coconut oil  1 application Topical  PRN Pincus LargePhelps, Jazma Y, DO      . witch hazel-glycerin (TUCKS) pad 1 application  1 application Topical PRN Pincus LargePhelps, Jazma Y, DO       And  . dibucaine (NUPERCAINAL) 1 % rectal ointment 1 application  1 application Rectal PRN Pincus LargePhelps, Jazma Y, DO      . diphenhydrAMINE (BENADRYL) capsule 25 mg  25 mg Oral Q6H PRN Caryl AdaPhelps, Jazma Y, DO      . ibuprofen (ADVIL,MOTRIN) tablet 600 mg  600 mg Oral Q6H PRN [place] BingPickens, Nevena Rozenberg, MD   600 mg at 04/02/17 1034  . labetalol (NORMODYNE,TRANDATE) injection 20-80 mg  20-80 mg Intravenous Q10 min PRN Caryl AdaPhelps, Jazma Y, DO      . lactated ringers infusion   Intravenous Continuous [place] BingPickens, Adasha Boehme, MD   Stopped at 04/01/17 2259  . menthol-cetylpyridinium (CEPACOL) lozenge 3 mg  1 lozenge Oral Q2H PRN Caryl AdaPhelps, Jazma Y, DO      . NIFEdipine (PROCARDIA-XL/ADALAT-CC/NIFEDICAL-XL) 24 hr tablet 30 mg  30 mg Oral Daily  BingPickens, Hassan Blackshire, MD   30 mg at 04/02/17 1034  . oxyCODONE (Oxy IR/ROXICODONE) immediate release tablet 10 mg  10 mg Oral Q4H PRN Caryl AdaPhelps, Jazma Y, DO   10 mg at 04/02/17 1033  .  oxyCODONE (Oxy IR/ROXICODONE) immediate release tablet 5 mg  5 mg Oral Q4H PRN Caryl Ada Y, DO   5 mg at 04/01/17 1721  . polyethylene glycol (MIRALAX / GLYCOLAX) packet 17 g  17 g Oral Daily [place] Bing, MD   17 g at 04/02/17 1035  . prenatal multivitamin tablet 1 tablet  1 tablet Oral Q1200 Pincus Large, DO   1 tablet at 04/02/17 1033  . senna-docusate (Senokot-S) tablet 2 tablet  2 tablet Oral QHS PRN [place] Bing, MD      . simethicone [facility]) chewable tablet 80 mg  80 mg Oral TID PC Phelps, Jazma Y, DO   80 mg at 04/02/17 1034  . Tdap (BOOSTRIX) injection 0.5 mL  0.5 mL Intramuscular Once Pincus Large, DO   Stopped at 04/01/17 1604  . zolpidem (AMBIEN) tablet 5 mg  5 mg Oral QHS PRN Pincus Large, DO        Labs:  No new labs  Radiology: no new imaging.   Assessment & Plan:  Pt doing well *PP: routine care. Pumping. Newborn doing well in the NICU. A  POS *Severe pre-x: continue Continue procardia. Continue to follow BPs closely. 1wk bp check request sent to clinic *PPx: scds, oob ad lib *FEN/GI: regular diet *Dispo: likely pod#3.   Cornelia Copa MD Attending Center for [facility])

## 2017-04-03 MED ORDER — NIFEDIPINE ER 30 MG PO TB24: 30.0000 mg | ORAL_TABLET | Freq: Every day | ORAL | 1 refills | Status: DC

## 2017-04-03 MED ORDER — IBUPROFEN 600 MG PO TABS: 600.0000 mg | ORAL_TABLET | Freq: Four times a day (QID) | ORAL | 0 refills | Status: DC | PRN

## 2017-04-03 MED ORDER — POLYETHYLENE GLYCOL 3350 17 G PO PACK: 17.0000 g | PACK | Freq: Every day | ORAL | 0 refills | Status: DC

## 2017-04-03 MED ORDER — SIMETHICONE 80 MG PO CHEW: 80.0000 mg | CHEWABLE_TABLET | Freq: Four times a day (QID) | ORAL | 0 refills | Status: DC | PRN

## 2017-04-03 MED ORDER — OXYCODONE-ACETAMINOPHEN 5-325 MG PO TABS: 1.0000 | ORAL_TABLET | ORAL | 0 refills | Status: DC | PRN

## 2017-04-03 NOTE — Progress Notes (Signed)
Discharge teaching complete with pt. Pt understood all information and did not have any questions. Pt discharged home to family. 

## 2017-04-03 NOTE — Progress Notes (Signed)
Baby love nurse called and message left with pt name, medical record number, and contact information to set up a BP check in one week.

## 2017-04-03 NOTE — Discharge Instructions (Signed)
°Cesarean Delivery, Care After °Refer to this sheet in the next few weeks. These instructions provide you with information on caring for yourself after your procedure. Your health care provider may also give you specific instructions. Your treatment has been planned according to current medical practices, but problems sometimes occur. Call your health care provider if you have any problems or questions after you go home. °HOME CARE INSTRUCTIONS  °· Only take over-the-counter or prescription medications as directed by your health care provider. °· Do not drink alcohol, especially if you are breastfeeding or taking medication to relieve pain. °· Do not  smoke tobacco. °· Continue to use good perineal care. Good perineal care includes: °¨ Wiping your perineum from front to back. °¨ Keeping your perineum clean. °· Check your surgical cut (incision) daily for increased redness, drainage, swelling, or separation of skin. °· Shower and clean your incision gently with soap and water every day, by letting warm and soapy water run over the incision, and then pat it dry. If your health care provider says it is okay, leave the incision uncovered. Use a bandage (dressing) if the incision is draining fluid or appears irritated. If the adhesive strips across the incision do not fall off within 7 days, carefully peel them off, after a shower. °· Hug a pillow when coughing or sneezing until your incision is healed. This helps to relieve pain. °· Do not use tampons, douches or have sexual intercourse, until your health care provider says it is okay. °· Wear a well-fitting bra that provides breast support. °· Limit wearing support panties or control-top hose. °· Drink enough fluids to keep your urine clear or pale yellow. °· Eat high-fiber foods such as whole grain cereals and breads, brown rice, beans, and fresh fruits and vegetables every day. These foods may help prevent or relieve constipation. °· Resume activities such as  climbing stairs, driving, lifting, exercising, or traveling as directed by your health care provider. °· Try to have someone help you with your household activities and your newborn for at least a few days after you leave the hospital. °· Rest as much as possible. Try to rest or take a nap when your newborn is sleeping. °· Increase your activities gradually. °· Do not lift more than 15lbs until directed by a provider. °· Keep all of your scheduled postpartum appointments. It is very important to keep your scheduled follow-up appointments. At these appointments, your health care provider will be checking to make sure that you are healing physically and emotionally. °SEEK MEDICAL CARE IF:  °· You are passing large clots from your vagina. Save any clots to show your health care provider. °· You have a foul smelling discharge from your vagina. °· You have trouble urinating. °· You are urinating frequently. °· You have pain when you urinate. °· You have a change in your bowel movements. °· You have increasing redness, pain, or swelling near your incision. °· You have pus draining from your incision. °· Your incision is separating. °· You have painful, hard, or reddened breasts. °· You have a severe headache. °· You have blurred vision or see spots. °· You feel sad or depressed. °· You have thoughts of hurting yourself or your newborn. °· You have questions about your care, the care of your newborn, or medications. °· You are dizzy or light-headed. °· You have a rash. °· You have pain, redness, or swelling at the site of the removed intravenous access (IV) tube. °· You have nausea   or vomiting. °· You stopped breastfeeding and have not had a menstrual period within 12 weeks of stopping. °· You are not breastfeeding and have not had a menstrual period within 12 weeks of delivery. °· You have a fever. °SEEK IMMEDIATE MEDICAL CARE IF: °· You have persistent pain. °· You have chest pain. °· You have shortness of breath. °· You  faint. °· You have leg pain. °· You have stomach pain. °· Your vaginal bleeding saturates 2 or more sanitary pads in 1 hour. °MAKE SURE YOU:  °· Understand these instructions. °· Will watch your condition. °· Will get help right away if you are not doing well or get worse. °Document Released: 11/17/2001 Document Revised: 07/12/2013 Document Reviewed: 10/23/2011 °Exit[employer]® Patient Information ©2015 [employer]. This information is not intended to replace advice given to you by your health care provider. Make sure you discuss any questions you have with your health care provider. ° ° °

## 2017-04-03 NOTE — Lactation Note (Signed)
This note was copied from a baby's chart. Lactation Consultation Note  Patient Name: Boy Cruzita LedererCierra Smylie ZOXWR'UToday's Date: 04/03/2017 Reason for consult: Follow-up assessment;Late-preterm 34-36.6wks;Infant < 6lbs;Primapara;1st time breastfeeding;Other (Comment)(Lactation induction / see LC note / mom for D/C ) Per mom has pumped x 6 in the last 24 hours with 10 ml being the max amount for pumping session.  LC reviewed supply and demand and the importance of consistent pumping both breast for 15 - 20 mins with hand expressing before and after pumping.  Per mom the #24 Flange is comfortable and she will be getting a DEBP ( Medela ) from a friend today .  LC discussed adding power pumping once a day over 60 mins ( 20 mins on 10 mins off over 60 mins or 10 mins on 10 mins off over 60 mins.  Sore nipple and engorgement prevention and tx reviewed.  LC recommended pumping when visiting baby in NICU in the pumping rooms and bringing her set up.  Mother informed of post-discharge support and given phone number to the lactation department, including services for phone call assistance; out-patient appointments; and breastfeeding support group. List of other breastfeeding resources in the community given in the handout. Encouraged mother to call for problems or concerns related to breastfeeding.  LC praised mom for her efforts pumping and encouraged to increase to 8 - 10 x's in 24 hours.    Maternal Data Has patient been taught Hand Expression?: Yes(LC reviewed )  Feeding Feeding Type: Formula Length of feed: 30 min  LATCH Score                   Interventions Interventions: Breast feeding basics reviewed  Lactation Tools Discussed/Used WIC Program: No   Consult Status Consult Status: PRN(baby in NICU ) Follow-up type: Other (comment)    Matilde SprangMargaret Ann Amelia Burgard 04/03/2017, 1:37 PM

## 2017-04-03 NOTE — Discharge Summary (Signed)
Obstetrical Discharge Summary  Date of Admission: 03/31/2017 Date of Discharge: 04/03/2017  Primary OB: [facility]  Gestational Age at Delivery: 1629w6d   Antepartum complications: GHTN Reason for Admission: need for immediate delivery due to NRFHT in OB triage Date of Delivery: 03/31/2017  Delivered By: Dr. Marlow Baarsyanna Clark MD (Attending) and [name], MD (OB Fellow) Delivery Type: stat pLTCS Intrapartum complications/course: fetal bradycardia, abruptio placenta diagnosed intra operatively Anesthesia: general Placenta: Delivered and expressed via active management. Intact: yes. To pathology: yes.  Laceration: n/a Episiotomy: none EBL: 871mL Baby: Liveborn female, APGARs 2/5/7, weight 1900 g.    Discharge Diagnosis: Delivered.   Postpartum course: Patient had 24 hours of PP Mg for pre-eclampsia with severe features based on BPs. She was started on procardia 30 qday during this period and did well on it and when she came off Mg, too.  On the day of discharge she was meeting all PP/PO goals including flatus.   Discharge Vital Signs:  Current Vital Signs 24h Vital Sign Ranges  T 98.3 F (36.8 C) Temp  Avg: 98.7 F (37.1 C)  Min: 97.7 F (36.5 C)  Max: 99.7 F (37.6 C)  BP (!) 141/86 BP  Min: 126/85  Max: 150/88  HR 96 Pulse  Avg: 88.5  Min: 76  Max: 97  RR 17 Resp  Avg: 17.8  Min: 17  Max: 18  SaO2 98 % Not Delivered SpO2  Avg: 99.6 %  Min: 98 %  Max: 100 %       24 Hour I/O Current Shift I/O  Time Ins Outs No intake/output data recorded. No intake/output data recorded.   Discharge Exam:  NAD Perineum: deferred Abdomen: firm fundus below the umbilicus, NTTP, non distended, +bowel sounds.  Incision c/d/i with honeycomb dressing in place RRR no MRGs CTAB Ext: no c/c/e  Recent Labs  Lab 03/31/17 2050 04/01/17 0550  WBC 9.3 19.1*  HGB 12.1 11.5*  HCT 36.3 34.5*  PLT 136* 133*    Disposition: Home  Rh Immune globulin given: not applicable Rubella  vaccine given: yes Tdap vaccine given in AP or PP setting: yes Flu vaccine given in AP or PP setting: yes  Contraception: patient unsure  Prenatal/Postnatal Panel: A POS//Rubella Immune//RPR negative//HIV negative/HepB Surface Ag negative//pap ascus/hpv neg (date: 2018)//plans to breastfeed  Plan:  Jamie Bautista was discharged to home in good condition. Follow-up appointment with baby love for a BP check in 1 week and with [facility] in 1-2 week for a BP and incision check visit  Future Appointments  Date Time Provider Department Center  04/15/2017  2:00 PM [facility] NURSE [facility]  04/29/2017  3:55 PM Pincus LargePhelps, Jazma Y, DO [facility]    Discharge Medications: Allergies as of 04/03/2017   No Known Allergies     Medication List    TAKE these medications   ibuprofen 600 MG tablet Commonly known as:  ADVIL,MOTRIN Take 1 tablet (600 mg total) by mouth every 6 (six) hours as needed for mild pain or cramping.   NIFEdipine 30 MG 24 hr tablet Commonly known as:  PROCARDIA-XL/ADALAT CC Take 1 tablet (30 mg total) by mouth daily. Start taking on:  04/04/2017   oxyCODONE-acetaminophen 5-325 MG tablet Commonly known as:  PERCOCET/ROXICET Take 1-2 tablets by mouth every 4 (four) hours as needed for severe pain.   polyethylene glycol packet Commonly known as:  MIRALAX / GLYCOLAX Take 17 g by mouth daily.   PRENATAL VITAMIN PO Take by mouth.  simethicone 80 MG chewable tablet Commonly known as:  MYLICON Chew 1 tablet (80 mg total) by mouth 4 (four) times daily as needed for flatulence.       Cornelia Copa MD Attending [facility])

## 2017-04-04 DIAGNOSIS — O1414 Severe pre-eclampsia complicating childbirth: Secondary | ICD-10-CM | POA: Diagnosis present

## 2017-04-15 ENCOUNTER — Ambulatory Visit: Payer: Medicaid Other

## 2017-04-28 ENCOUNTER — Telehealth: Payer: Self-pay | Admitting: *Deleted

## 2017-04-28 DIAGNOSIS — F53 Postpartum depression: Secondary | ICD-10-CM

## 2017-04-28 DIAGNOSIS — O99345 Other mental disorders complicating the puerperium: Secondary | ICD-10-CM

## 2017-04-28 NOTE — Telephone Encounter (Signed)
Lum KeasSuronva, a home health nurse called the office regarding pt [place] score this morning which was 17. Also BP 130/84, pt taking Procardia. Pt has appt tomorrow with Dr Doroteo GlassmanPhelps. [facility].

## 2017-04-29 ENCOUNTER — Other Ambulatory Visit (HOSPITAL_COMMUNITY)
Admission: RE | Admit: 2017-04-29 | Discharge: 2017-04-29 | Disposition: A | Discharge status: Home / Self Care | Payer: Medicaid Other | Source: Ambulatory Visit | Attending: Obstetrics and Gynecology | Admitting: Obstetrics and Gynecology

## 2017-04-29 ENCOUNTER — Encounter: Payer: Self-pay | Admitting: Obstetrics and Gynecology

## 2017-04-29 ENCOUNTER — Ambulatory Visit (INDEPENDENT_AMBULATORY_CARE_PROVIDER_SITE_OTHER): Payer: Medicaid Other | Admitting: Clinical

## 2017-04-29 ENCOUNTER — Ambulatory Visit (INDEPENDENT_AMBULATORY_CARE_PROVIDER_SITE_OTHER): Payer: Medicaid Other | Admitting: Obstetrics and Gynecology

## 2017-04-29 DIAGNOSIS — F4323 Adjustment disorder with mixed anxiety and depressed mood: Secondary | ICD-10-CM

## 2017-04-29 DIAGNOSIS — Z1389 Encounter for screening for other disorder: Secondary | ICD-10-CM

## 2017-04-29 DIAGNOSIS — Z124 Encounter for screening for malignant neoplasm of cervix: Secondary | ICD-10-CM

## 2017-04-29 NOTE — BH Specialist Note (Signed)
Integrated Behavioral Health Initial Visit  MRN: [id number] Name: Jamie Bautista  Number of Integrated Behavioral Health Clinician visits:: 1/6 Session Start time: 4:20  Session End time: 4:41 Total time: 20 minutes  Type of Service: Integrated Behavioral Health- Individual/Family Interpretor:No. Interpretor Name and Language: n/a   Warm Hand Off Completed.       SUBJECTIVE: Jamie Bautista is a 22 y.o. female accompanied by n/a Patient was referred by Dr Doroteo Glassman for depression. Patient reports the following symptoms/concerns: Pt states she has been feeling anxious and depressed postpartum; has difficulty falling asleep. Pt open to problem-solve and taking home educational material today to help cope.  Duration of problem: Postpartum; Severity of problem: moderate  OBJECTIVE: Mood: Normal and Affect: Appropriate Risk of harm to self or others: No plan to harm self or others  LIFE CONTEXT: Family and Social:  School/Work: Will return to work part-time within two weeks; plans to return to [employer] in the future Self-Care: Music/dance as self-care Life Changes: Postpartum  GOALS ADDRESSED: Patient will: 1. Reduce symptoms of: anxiety and depression 2. Increase knowledge and/or ability of: coping skills  3. Demonstrate ability to: Increase healthy adjustment to current life circumstances  INTERVENTIONS: Interventions utilized: Sleep Hygiene, Psychoeducation and/or Health Education and Link to [employer]  Standardized Assessments completed: GAD-7 and PHQ 9  ASSESSMENT: Patient currently experiencing Adjustment disorder with anxious and depressed mood.   Patient may benefit from psychoeducation and brief therapeutic interventions regarding coping with symptoms of depression and anxiety.  PLAN: 1. Follow up with behavioral health clinician on : Next medical visit, or earlier if symptoms do not decrease 2. Behavioral recommendations:  -Consider sleep app at night for  improved family sleep -Continue taking prenatal vitamins, as recommended by medical provider -Consider 20 minute timed nap every morning, if still tired (after drinking morning tea) -Consider setting aside time to bring music and dance into daily life again -Read educational materials regarding coping with symptoms of depression and anxiety  3. Referral(s): Integrated Art gallery manager (In Clinic) and [employer]:  [employer] 4. "From scale of 1-10, how likely are you to follow plan?": 10  Rae Lips, LCSW  Edinburgh Postnatal Depression Scale - 04/29/17 1613      Edinburgh Postnatal Depression Scale:  In the Past 7 Days   I have been able to laugh and see the funny side of things.  0    I have looked forward with enjoyment to things.  0    I have blamed myself unnecessarily when things went wrong.  2    I have been anxious or worried for no good reason.  2    I have felt scared or panicky for no good reason.  2    Things have been getting on top of me.  2    I have been so unhappy that I have had difficulty sleeping.  2    I have felt sad or miserable.  2    I have been so unhappy that I have been crying.  2    The thought of harming myself has occurred to me.  0    Edinburgh Postnatal Depression Scale Total  14      Depression screen [facility] 2/9 03/19/2017  Decreased Interest 1  Down, Depressed, Hopeless 0  PHQ - 2 Score 1  Altered sleeping 2  Tired, decreased energy 1  Change in appetite 1  Feeling bad or failure about yourself  1  Trouble concentrating 0  Moving slowly or fidgety/restless 0  Suicidal thoughts 0  PHQ-9 Score 6   GAD 7 : Generalized Anxiety Score 03/19/2017  Nervous, Anxious, on Edge 0  Control/stop worrying 0  Worry too much - different things 1  Trouble relaxing 1  Restless 0  Easily annoyed or irritable 1  Afraid - awful might happen 0  Total GAD 7 Score 3

## 2017-04-29 NOTE — Patient Instructions (Signed)

## 2017-04-29 NOTE — Progress Notes (Signed)
  Subjective:     Jamie Bautista is a 22 y.o. female who presents for a postpartum visit. She is 4 weeks postpartum following a  Stat primary low cervical transverse Cesarean section for NRFHT. I have fully reviewed the prenatal and intrapartum course. The delivery was at 34/6 gestational weeks. Outcome: primary cesarean section, low transverse incision. Anesthesia: general. Postpartum course has been uncomplicated. Baby's course was complicated by NICU stay for 3 weeks but since discharge has been fine. Baby is feeding by both breast and bottle - Similac Neosure. Bleeding staining only. Bowel function is normal. Bladder function is normal. Patient is not sexually active. Contraception method is none. Postpartum depression screening: positive.  The following portions of the patient's history were reviewed and updated as appropriate: allergies, current medications, past family history, past medical history, past social history, past surgical history and problem list.  Review of Systems Pertinent items are noted in HPI.   Objective:    BP 130/78   Pulse 78   Ht _0  (1.6 m)   Wt 79.2 kg (174 lb 11.2 oz)   LMP 06/22/2016 (Approximate)   Breastfeeding? Yes   BMI 30.95 kg/m   General:  alert, cooperative and no distress   Breasts:  negative  Lungs: clear to auscultation bilaterally  Heart:  regular rate and rhythm  Abdomen: soft, non-tender; bowel sounds normal; no masses,  no organomegaly, incision well healed   Vulva:  normal  Vagina: normal vagina, no discharge, exudate, lesion, or erythema  Cervix:  no lesions and retroverted  Corpus: normal  Adnexa:  normal adnexa and no mass, fullness, tenderness        Assessment:   Normal postpartum exam. Pap smear done at today's visit.   Plan:    1. Contraception: undecidied. Not sexcually active currently. Thinking of getting IUD but wants more information. Handout provided for review. Patient encouraged to scheduled a follow-up soon for  placement. 2. Adjustment disorder: Patient with elevated Edinburgh score of 14. Met with Behavioral Health. Does not want to start medication at this time. Will follow-up with Roselyn Reef. 3. Antepartum GHTN/Preeclampsia: Continues to take Procardia. Discussed with patient she can try stopping medication at this time and monitoring BP at home. If greater than 150/105 should resume medication.  4. Pap smear performed today. Follow-up with patient on results when available.  5. Follow up as needed.    Luiz Blare, DO OB Fellow Center for [facility], [facility]

## 2017-04-30 ENCOUNTER — Encounter: Payer: Self-pay | Admitting: Obstetrics and Gynecology

## 2017-04-30 LAB — POCT PREGNANCY, URINE: PREG TEST UR: NEGATIVE

## 2017-05-01 ENCOUNTER — Telehealth: Payer: Self-pay | Admitting: *Deleted

## 2017-05-01 LAB — CYTOLOGY - PAP: Diagnosis: NEGATIVE

## 2017-05-01 NOTE — Telephone Encounter (Signed)
[facility] from [facility] CNM need to notify patient pap normal and needs pap in 3 years. I called [name] and left a message I am calling with some non-urgent results- please call us back .

## 2017-05-05 NOTE — Telephone Encounter (Signed)
I called Jamie Bautista and informed her results and recommendation per [name]., CNM

## 2017-06-09 NOTE — BH Specialist Note (Deleted)
Integrated Behavioral Health Follow Up Visit  MRN: [id number] Name: Jamie BerkshireCierra J Rhudy  Number of Integrated Behavioral Health Clinician visits: {IBH Number of Visits:21014052} Session Start time: ***  Session End time: *** Total time: {IBH Total Time:21014050}  Type of Service: Integrated Behavioral Health- Individual/Family Interpretor:{yes WU:[id number]}no:[id number]} Interpretor Name and Language: ***  SUBJECTIVE: Jamie Bautista is a 22 y.o. female accompanied by {Patient accompanied [phone]} Patient was referred by *** for ***. Patient reports the following symptoms/concerns: *** Duration of problem: ***; Severity of problem: {Mild/Moderate/Severe:20260}  OBJECTIVE: Mood: {BHH MOOD:22306} and Affect: {BHH AFFECT:22307} Risk of harm to self or others: {CHL AMB BH Suicide Current Mental Status:21022748}  LIFE CONTEXT: Family and Social: *** School/Work: *** Self-Care: *** Life Changes: ***  GOALS ADDRESSED: Patient will: 1.  Reduce symptoms of: {IBH Symptoms:21014056}  2.  Increase knowledge and/or ability of: {IBH Patient Tools:21014057}  3.  Demonstrate ability to: {IBH Goals:21014053}  INTERVENTIONS: Interventions utilized:  {IBH Interventions:21014054} Standardized Assessments completed: {IBH Screening Tools:21014051}  ASSESSMENT: Patient currently experiencing ***.   Patient may benefit from ***.  PLAN: 1. Follow up with behavioral health clinician on : *** 2. Behavioral recommendations: *** 3. Referral(s): {IBH Referrals:21014055} 4. "From scale of 1-10, how likely are you to follow plan?": ***  Valetta CloseJamie C McMannes, LCSW

## 2017-06-10 ENCOUNTER — Ambulatory Visit: Payer: Medicaid Other | Admitting: Obstetrics and Gynecology

## 2019-09-08 ENCOUNTER — Emergency Department (HOSPITAL_COMMUNITY)
Admission: EM | Admit: 2019-09-08 | Discharge: 2019-09-08 | Disposition: A | Discharge status: Home / Self Care | Payer: Medicaid Other | Attending: Emergency Medicine | Admitting: Emergency Medicine

## 2019-09-08 ENCOUNTER — Other Ambulatory Visit: Payer: Self-pay

## 2019-09-08 ENCOUNTER — Encounter (HOSPITAL_COMMUNITY): Payer: Self-pay | Admitting: Emergency Medicine

## 2019-09-08 DIAGNOSIS — R5383 Other fatigue: Secondary | ICD-10-CM | POA: Insufficient documentation

## 2019-09-08 DIAGNOSIS — R509 Fever, unspecified: Secondary | ICD-10-CM | POA: Diagnosis not present

## 2019-09-08 DIAGNOSIS — L539 Erythematous condition, unspecified: Secondary | ICD-10-CM | POA: Diagnosis not present

## 2019-09-08 DIAGNOSIS — J029 Acute pharyngitis, unspecified: Secondary | ICD-10-CM | POA: Insufficient documentation

## 2019-09-08 LAB — GROUP A STREP BY PCR: Group A Strep by PCR: NOT DETECTED

## 2019-09-08 MED ORDER — ACETAMINOPHEN 325 MG PO TABS: 650.0000 mg | ORAL_TABLET | Freq: Once | ORAL | Status: DC | PRN

## 2019-09-08 NOTE — ED Provider Notes (Signed)
[facility]-EMERGENCY DEPT Provider Note   CSN: [id number] Arrival date & time: 09/08/19  0243     History Chief Complaint  Patient presents with   Sore Throat    Jamie Bautista is a 24 y.o. female who presents to the ED today with complaint of gradual onset, constant, achy, sore throat x 2-3 days.  Patient also complains of fever with T-max 101.0 Fahrenheit yesterday.  She took over-the-counter ibuprofen with relief of her fever however states it did not help her sore throat.  She has been able to swallow liquids and solids without difficulty.  She denies any difficulty breathing, drooling, trismus, voice change, abdominal pain, nausea, vomiting, any other associated symptoms.  She does report she was recently around her nephew who was diagnosed with an ear infection.  She has not been vaccinated for Covid however is not concerned that she has Covid at this time.   The history is provided by the patient and medical records.       Past Medical History:  Diagnosis Date   Medical history non-contributory     Patient Active Problem List   Diagnosis Date Noted   Severe pre-eclampsia, with delivery 04/04/2017   Status post primary low transverse cesarean section 03/31/2017   Gestational hypertension 03/19/2017   Supervision of high risk pregnancy, antepartum 03/19/2017   Abnormal findings on antenatal screening     Past Surgical History:  Procedure Laterality Date   CESAREAN SECTION N/A 03/31/2017   Procedure: CESAREAN SECTION;  Surgeon: Marlow Baars, MD;  Location: [facility] BIRTHING SUITES;  Service: Obstetrics;  Laterality: N/A;   NO PAST SURGERIES       OB History    Gravida  3   Para  0   Term  0   Preterm  0   AB  2   Living  0     SAB  1   TAB  1   Ectopic  0   Multiple  0   Live Births  0           History reviewed. No pertinent family history.  Social History   Tobacco Use   Smoking status: Former Smoker     Packs/day: 0.50    Types: Cigarettes    Quit date: 10/27/2016    Years since quitting: 2.8   Smokeless tobacco: Never Used  Substance Use Topics   Alcohol use: No   Drug use: Yes    Types: Marijuana    Comment: none in pregnancy    Home Medications Prior to Admission medications   Medication Sig Start Date End Date Taking? Authorizing Provider  ibuprofen (ADVIL,MOTRIN) 600 MG tablet Take 1 tablet (600 mg total) by mouth every 6 (six) hours as needed for mild pain or cramping. 04/03/17   Leonidas Bing, MD  NIFEdipine (PROCARDIA-XL/ADALAT CC) 30 MG 24 hr tablet Take 1 tablet (30 mg total) by mouth daily. 04/04/17   [place] Bing, MD  polyethylene glycol (MIRALAX / GLYCOLAX) packet Take 17 g by mouth daily. 04/03/17   [place] Bing, MD  Prenatal Vit-Fe Fumarate-FA (PRENATAL VITAMIN PO) Take by mouth.    [provider]  simethicone (MYLICON) 80 MG chewable tablet Chew 1 tablet (80 mg total) by mouth 4 (four) times daily as needed for flatulence. 04/03/17   [place] Bing, MD    Allergies    Patient has no known allergies.  Review of Systems   Review of Systems  Constitutional: Positive for fatigue and fever.  Negative for chills.  HENT: Positive for sore throat. Negative for trouble swallowing and voice change.     Physical Exam Updated Vital Signs BP 140/86 (BP Location: Right Arm)    Pulse 71    Temp 98.6 F (37 C) (Oral)    Resp 16    Ht 5\' 3"  (1.6 m)    Wt 88 kg    LMP 08/19/2019    SpO2 100%    BMI 34.37 kg/m   Physical Exam Vitals and nursing note reviewed.  Constitutional:      Appearance: She is not ill-appearing or diaphoretic.  HENT:     Head: Normocephalic and atraumatic.     Right Ear: Tympanic membrane normal.     Left Ear: Tympanic membrane normal.     Mouth/Throat:     Mouth: Mucous membranes are moist.     Pharynx: Uvula midline. Posterior oropharyngeal erythema present.     Tonsils: No tonsillar exudate or tonsillar abscesses.  Eyes:      Conjunctiva/sclera: Conjunctivae normal.  Cardiovascular:     Rate and Rhythm: Normal rate and regular rhythm.  Pulmonary:     Effort: Pulmonary effort is normal.     Breath sounds: Normal breath sounds.  Skin:    General: Skin is warm and dry.     Coloration: Skin is not jaundiced.  Neurological:     Mental Status: She is alert.     ED Results / Procedures / Treatments   Labs (all labs ordered are listed, but only abnormal results are displayed) Labs Reviewed  GROUP A STREP BY PCR    EKG None  Radiology No results found.  Procedures Procedures (including critical care time)  Medications Ordered in ED Medications  acetaminophen (TYLENOL) tablet 650 mg (has no administration in time range)    ED Course  I have reviewed the triage vital signs and the nursing notes.  Pertinent labs & imaging results that were available during my care of the patient were reviewed by me and considered in my medical decision making (see chart for details).    MDM Rules/Calculators/A&P                          24 year old female who presents to the ED today with complaint of sore throat for the past 2 to 3 days.  On arrival to the ED patient is afebrile, nontachycardic and nontachypneic.  She appears to be in no acute distress.  She does report that she had a fever of T-max 101.0 Fahrenheit yesterday however this is resolved with over-the-counter medications.  On exam patient has posterior oropharyngeal erythema, no edema.  No exudate or tonsillar abscess appreciated.  Uvula is midline.  Patient is tolerating her secretions without difficulty and no hot potato voice appreciated.  She was swabbed for strep while in the waiting room which has returned negative.  She was recently around her nephew who was diagnosed with an ear infection, have discussed getting tested for Covid however patient is unconcerned that she has Covid at this time.  She is ready to be discharged home with symptomatic  relief.  She states she needs to go to work at 715 this morning.  Will discharge home at this time with symptomatic relief.  Have instructed on strict return precautions.  Patient is in agreement with plan is stable for discharge home.   This note was prepared using Dragon voice recognition software and may include unintentional dictation errors  due to the inherent limitations of voice recognition software.  Final Clinical Impression(s) / ED Diagnoses Final diagnoses:  Viral pharyngitis    Rx / DC Orders ED Discharge Orders    None       Discharge Instructions     Your strep test was negative today. Your symptoms are likely viral in nature. Please continue taking OTC medication as needed. Drink plenty of fluids to stay hydrated. You can use OTC cough drops or antiseptic mouth spray to numb your throat.   Follow up with your PCP. If you do not have one you can follow up with [facility] for primary care needs  Return to the ED for any worsening symptoms including worsening pain, inability to swallow liquids or solids, drooling on yourself, voice changes, persistent fevers, or symptoms that last longer than 10 days       Tanda Rockers, PA-C 09/08/19 0659    Molpus, Jonny Ruiz, MD 09/08/19 [phone]

## 2019-09-08 NOTE — Discharge Instructions (Signed)
Your strep test was negative today. Your symptoms are likely viral in nature. Please continue taking OTC medication as needed. Drink plenty of fluids to stay hydrated. You can use OTC cough drops or antiseptic mouth spray to numb your throat.   Follow up with your PCP. If you do not have one you can follow up with [facility] Wellness for primary care needs  Return to the ED for any worsening symptoms including worsening pain, inability to swallow liquids or solids, drooling on yourself, voice changes, persistent fevers, or symptoms that last longer than 10 days

## 2019-09-08 NOTE — ED Triage Notes (Signed)
Patient complaining of a sore throat. Patient has a small hole pocket beside of her tonsil on patient left side. Patient has being hurting for a few days.

## 2019-10-27 ENCOUNTER — Ambulatory Visit (INDEPENDENT_AMBULATORY_CARE_PROVIDER_SITE_OTHER): Payer: Medicaid Other

## 2019-10-27 ENCOUNTER — Encounter (HOSPITAL_COMMUNITY): Payer: Self-pay | Admitting: Emergency Medicine

## 2019-10-27 ENCOUNTER — Ambulatory Visit (HOSPITAL_COMMUNITY)
Admission: EM | Admit: 2019-10-27 | Discharge: 2019-10-27 | Disposition: A | Discharge status: Home / Self Care | Payer: Medicaid Other | Attending: Family Medicine | Admitting: Family Medicine

## 2019-10-27 ENCOUNTER — Other Ambulatory Visit: Payer: Self-pay

## 2019-10-27 DIAGNOSIS — M79671 Pain in right foot: Secondary | ICD-10-CM | POA: Diagnosis not present

## 2019-10-27 DIAGNOSIS — M79674 Pain in right toe(s): Secondary | ICD-10-CM

## 2019-10-27 DIAGNOSIS — R21 Rash and other nonspecific skin eruption: Secondary | ICD-10-CM | POA: Diagnosis not present

## 2019-10-27 MED ORDER — PERMETHRIN 5 % EX CREA: TOPICAL_CREAM | CUTANEOUS | 1 refills | Status: DC

## 2019-10-27 NOTE — ED Provider Notes (Signed)
[facility]    CSN: [id number] Arrival date & time: 10/27/19  1025      History   Chief Complaint Chief Complaint  Patient presents with  . Toe Pain    HPI Jamie Bautista is a 24 y.o. female.   Pt is a 24 year old female that presents with right 4th toe pain. Started after stubbing toe. This has been present x 5 weeks. Not improving. Swelling is present. Painful to walk.  Patient also has rash.  Son has similar rash and worse.  The rash is itchy.  Denies any fever, joint pain. Denies any recent changes in lotions, detergents, foods or other possible irritants. No recent travel. Nobody else at home has the rash. Patient has been outside but denies any contact with plants or insects. No new foods or medications.    .     Past Medical History:  Diagnosis Date  . Medical history non-contributory     Patient Active Problem List   Diagnosis Date Noted  . Severe pre-eclampsia, with delivery 04/04/2017  . Status post primary low transverse cesarean section 03/31/2017  . Gestational hypertension 03/19/2017  . Supervision of high risk pregnancy, antepartum 03/19/2017  . Abnormal findings on antenatal screening     Past Surgical History:  Procedure Laterality Date  . CESAREAN SECTION N/A 03/31/2017   Procedure: CESAREAN SECTION;  Surgeon: Marlow Baars, MD;  Location: [facility] BIRTHING SUITES;  Service: Obstetrics;  Laterality: N/A;  . NO PAST SURGERIES      OB History    Gravida  3   Para  0   Term  0   Preterm  0   AB  2   Living  0     SAB  1   TAB  1   Ectopic  0   Multiple  0   Live Births  0            Home Medications    Prior to Admission medications   Medication Sig Start Date End Date Taking? Authorizing Provider  ibuprofen (ADVIL,MOTRIN) 600 MG tablet Take 1 tablet (600 mg total) by mouth every 6 (six) hours as needed for mild pain or cramping. 04/03/17   [place] Bing, MD  NIFEdipine (PROCARDIA-XL/ADALAT CC) 30 MG 24 hr tablet  Take 1 tablet (30 mg total) by mouth daily. 04/04/17   [place] Bing, MD  permethrin (ELIMITE) 5 % cream Apply to affected area once 10/27/19   Dahlia Byes A, NP  polyethylene glycol (MIRALAX / GLYCOLAX) packet Take 17 g by mouth daily. 04/03/17   [place] Bing, MD  Prenatal Vit-Fe Fumarate-FA (PRENATAL VITAMIN PO) Take by mouth.    [provider]  simethicone (MYLICON) 80 MG chewable tablet Chew 1 tablet (80 mg total) by mouth 4 (four) times daily as needed for flatulence. 04/03/17   [place] Bing, MD    Family History Family History  Problem Relation Age of Onset  . Heart failure Mother   . Healthy Father     Social History Social History   Tobacco Use  . Smoking status: Former Smoker    Packs/day: 0.50    Types: Cigarettes    Quit date: 10/27/2016    Years since quitting: 3.0  . Smokeless tobacco: Never Used  Substance Use Topics  . Alcohol use: No  . Drug use: Yes    Types: Marijuana    Comment: none in pregnancy     Allergies   Patient has no known allergies.  Review of Systems Review of Systems   Physical Exam Triage Vital Signs ED Triage Vitals  Enc Vitals Group     BP 10/27/19 1152 133/72     Pulse Rate 10/27/19 1152 (!) 57     Resp 10/27/19 1152 18     Temp 10/27/19 1152 98.4 F (36.9 C)     Temp Source 10/27/19 1152 Oral     SpO2 10/27/19 1152 100 %     Weight --      Height --      Head Circumference --      Peak Flow --      Pain Score 10/27/19 1149 9     Pain Loc --      Pain Edu? --      Excl. in GC? --    No data found.  Updated Vital Signs BP 133/72 (BP Location: Right Arm)   Pulse (!) 57   Temp 98.4 F (36.9 C) (Oral)   Resp 18   LMP 10/26/2019   SpO2 100%   Visual Acuity Right Eye Distance:   Left Eye Distance:   Bilateral Distance:    Right Eye Near:   Left Eye Near:    Bilateral Near:     Physical Exam Vitals and nursing note reviewed.  Constitutional:      General: She is not in acute distress.     Appearance: Normal appearance. She is not ill-appearing, toxic-appearing or diaphoretic.  HENT:     Head: Normocephalic.     Nose: Nose normal.     Mouth/Throat:     Pharynx: Oropharynx is clear.  Eyes:     Conjunctiva/sclera: Conjunctivae normal.  Pulmonary:     Effort: Pulmonary effort is normal.  Musculoskeletal:        General: Normal range of motion.     Cervical back: Normal range of motion.     Comments: Right 4th toe swelling with limited ROM.   Skin:    General: Skin is warm and dry.     Findings: Rash present.  Neurological:     Mental Status: She is alert.  Psychiatric:        Mood and Affect: Mood normal.      UC Treatments / Results  Labs (all labs ordered are listed, but only abnormal results are displayed) Labs Reviewed - No data to display  EKG   Radiology DG Foot Complete Right  Result Date: 10/27/2019 CLINICAL DATA:  Pain following recent fall EXAM: RIGHT FOOT COMPLETE - 3+ VIEW COMPARISON:  None. FINDINGS: Frontal, oblique, and lateral views were obtained. No fracture or dislocation. Joint spaces appear normal. No erosive change. There is an inferior calcaneal spur. IMPRESSION: No fracture or dislocation. No appreciable joint space narrowing. There is an inferior calcaneal spur. Electronically Signed   By: Bretta Bang III M.D.   On: 10/27/2019 13:11    Procedures Procedures (including critical care time)  Medications Ordered in UC Medications - No data to display  Initial Impression / Assessment and Plan / UC Course  I have reviewed the triage vital signs and the nursing notes.  Pertinent labs & imaging results that were available during my care of the patient were reviewed by me and considered in my medical decision making (see chart for details).     Right fourth toe pain and swelling x5 weeks. X-ray without any acute fractures. Concern for tendon injury based on continued pain and swelling. Will place in cam walker boot and have  her  follow with sports medicine. Ibuprofen for pain as needed.  Rash Son also has same rash. Concern for scabies.  Treating with Elimite. Recommended wash all bedding in hot water. Follow up as needed for continued or worsening symptoms  Final Clinical Impressions(s) / UC Diagnoses   Final diagnoses:  Rash and nonspecific skin eruption  Pain of toe of right foot     Discharge Instructions     Your x-ray did not show any fracture. I am to give you a boot to wear for the next few weeks to see if this helps Ibuprofen for pain and swelling. If this continues you may need to see a specialist. Rest, elevate and ice the foot.  Apply the cream from the neck down and leave on for 8 to 12 hours. Wash all bedding in hot water. Repeat in 2 weeks if needed    ED Prescriptions    Medication Sig Dispense Auth. Provider   permethrin (ELIMITE) 5 % cream Apply to affected area once 60 g Etty Isaac A, NP     PDMP not reviewed this encounter.   Dahlia Byes A, NP 10/27/19 1410

## 2019-10-27 NOTE — ED Triage Notes (Signed)
Pt presents with toe pain, states hit toe on door about 5 wks ago. C/o swelling, tender to touch, and difficult to walk and put pressure on.

## 2019-10-27 NOTE — Discharge Instructions (Signed)
Your x-ray did not show any fracture. I am to give you a boot to wear for the next few weeks to see if this helps Ibuprofen for pain and swelling. If this continues you may need to see a specialist. Rest, elevate and ice the foot.  Apply the cream from the neck down and leave on for 8 to 12 hours. Wash all bedding in hot water. Repeat in 2 weeks if needed

## 2020-12-15 ENCOUNTER — Other Ambulatory Visit: Payer: Self-pay

## 2020-12-15 ENCOUNTER — Encounter (HOSPITAL_COMMUNITY): Payer: Self-pay

## 2020-12-15 ENCOUNTER — Ambulatory Visit (HOSPITAL_COMMUNITY)
Admission: EM | Admit: 2020-12-15 | Discharge: 2020-12-15 | Disposition: A | Discharge status: Home / Self Care | Payer: Medicaid Other | Attending: Internal Medicine | Admitting: Internal Medicine

## 2020-12-15 DIAGNOSIS — N3001 Acute cystitis with hematuria: Secondary | ICD-10-CM | POA: Diagnosis not present

## 2020-12-15 LAB — POCT URINALYSIS DIPSTICK, ED / UC
Bilirubin Urine: NEGATIVE
Glucose, UA: NEGATIVE mg/dL
Ketones, ur: NEGATIVE mg/dL
Nitrite: POSITIVE — AB
Protein, ur: 100 mg/dL — AB
Specific Gravity, Urine: 1.015 (ref 1.005–1.030)
Urobilinogen, UA: 0.2 mg/dL (ref 0.0–1.0)
pH: 6 (ref 5.0–8.0)

## 2020-12-15 LAB — POC URINE PREG, ED: Preg Test, Ur: NEGATIVE

## 2020-12-15 MED ORDER — SULFAMETHOXAZOLE-TRIMETHOPRIM 800-160 MG PO TABS: 1.0000 | ORAL_TABLET | Freq: Two times a day (BID) | ORAL | 0 refills | Status: AC

## 2020-12-15 NOTE — ED Provider Notes (Signed)
[facility]    CSN: [id number] Arrival date & time: 12/15/20  0827      History   Chief Complaint Dysuria   HPI Jamie Bautista is a 25 y.o. female.   HPI  SUBJECTIVE: Jamie Bautista is a 25 y.o. female who complains of urinary frequency, urgency, hematuria and dysuria x 6 days but worsening over the past 2 to now include low back pain. She has had some bilateral sided flank/low back pain without fever, chills, or abnormal vaginal discharge or bleeding. She has tried AZO at the beginning of for symptoms with some relief. LMP: Sept 19th.    Past Medical History:  Diagnosis Date   Medical history non-contributory     Patient Active Problem List   Diagnosis Date Noted   Severe pre-eclampsia, with delivery 04/04/2017   Status post primary low transverse cesarean section 03/31/2017   Gestational hypertension 03/19/2017   Supervision of high risk pregnancy, antepartum 03/19/2017   Abnormal findings on antenatal screening     Past Surgical History:  Procedure Laterality Date   CESAREAN SECTION N/A 03/31/2017   Procedure: CESAREAN SECTION;  Surgeon: Marlow Baars, MD;  Location: [facility] BIRTHING SUITES;  Service: Obstetrics;  Laterality: N/A;   NO PAST SURGERIES      OB History     Gravida  3   Para  0   Term  0   Preterm  0   AB  2   Living  0      SAB  1   IAB  1   Ectopic  0   Multiple  0   Live Births  0            Home Medications    Prior to Admission medications   Medication Sig Start Date End Date Taking? Authorizing Provider  ibuprofen (ADVIL,MOTRIN) 600 MG tablet Take 1 tablet (600 mg total) by mouth every 6 (six) hours as needed for mild pain or cramping. 04/03/17   [place] Bing, MD  NIFEdipine (PROCARDIA-XL/ADALAT CC) 30 MG 24 hr tablet Take 1 tablet (30 mg total) by mouth daily. 04/04/17   [place] Bing, MD  permethrin (ELIMITE) 5 % cream Apply to affected area once 10/27/19   Dahlia Byes A, NP  polyethylene glycol (MIRALAX  / GLYCOLAX) packet Take 17 g by mouth daily. 04/03/17   [place] Bing, MD  Prenatal Vit-Fe Fumarate-FA (PRENATAL VITAMIN PO) Take by mouth.    [provider]  simethicone (MYLICON) 80 MG chewable tablet Chew 1 tablet (80 mg total) by mouth 4 (four) times daily as needed for flatulence. 04/03/17   [place] Bing, MD    Family History Family History  Problem Relation Age of Onset   Heart failure Mother    Healthy Father     Social History Social History   Tobacco Use   Smoking status: Former    Packs/day: 0.50    Types: Cigarettes    Quit date: 10/27/2016    Years since quitting: 4.1   Smokeless tobacco: Never  Substance Use Topics   Alcohol use: No   Drug use: Yes    Types: Marijuana    Comment: none in pregnancy     Allergies   Patient has no known allergies.   Review of Systems Review of Systems  As stated above in HPI Physical Exam Triage Vital Signs ED Triage Vitals  Enc Vitals Group     BP 12/15/20 0840 136/85     Pulse Rate 12/15/20  0840 90     Resp 12/15/20 0840 20     Temp 12/15/20 0840 98.6 F (37 C)     Temp Source 12/15/20 0840 Oral     SpO2 12/15/20 0840 97 %     Weight --      Height --      Head Circumference --      Peak Flow --      Pain Score 12/15/20 0839 7     Pain Loc --      Pain Edu? --      Excl. in GC? --    No data found.  Updated Vital Signs BP 136/85 (BP Location: Left Arm)   Pulse 90   Temp 98.6 F (37 C) (Oral)   Resp 20   LMP 11/27/2020 (Exact Date)   SpO2 97%   Physical Exam Vitals and nursing note reviewed.  Constitutional:      General: She is not in acute distress.    Appearance: Normal appearance. She is not ill-appearing, toxic-appearing or diaphoretic.  HENT:     Head: Normocephalic and atraumatic.  Cardiovascular:     Rate and Rhythm: Normal rate and regular rhythm.     Heart sounds: Normal heart sounds.  Pulmonary:     Effort: Pulmonary effort is normal.     Breath sounds: Normal breath  sounds.  Abdominal:     General: Bowel sounds are normal. There is no distension.     Palpations: Abdomen is soft. There is no mass.     Tenderness: There is no abdominal tenderness. There is no right CVA tenderness, left CVA tenderness, guarding or rebound.     Hernia: No hernia is present.  Musculoskeletal:     Cervical back: Neck supple.  Lymphadenopathy:     Cervical: No cervical adenopathy.  Skin:    General: Skin is warm.  Neurological:     Mental Status: She is alert and oriented to person, place, and time.     UC Treatments / Results  Labs (all labs ordered are listed, but only abnormal results are displayed) Labs Reviewed  POCT URINALYSIS DIPSTICK, ED / UC - Abnormal; Notable for the following components:      Result Value   Hgb urine dipstick LARGE (*)    Protein, ur 100 (*)    Nitrite POSITIVE (*)    Leukocytes,Ua LARGE (*)    All other components within normal limits  POC URINE PREG, ED    EKG   Radiology No results found.  Procedures Procedures (including critical care time)  Medications Ordered in UC Medications - No data to display  Initial Impression / Assessment and Plan / UC Course  I have reviewed the triage vital signs and the nursing notes.  Pertinent labs & imaging results that were available during my care of the patient were reviewed by me and considered in my medical decision making (see chart for details).     New. Acute cystitis with hematuria. Treating with Bactrim x 5 days given nitrate involvement. She is afebrile and has not had any nausea or vomiting so pyelonephritis is less likely. Discussed how to take her medication along with common potential side effects and precaution. Red flag signs and symptoms discussed.    Final Clinical Impressions(s) / UC Diagnoses   Final diagnoses:  None   Discharge Instructions   None    ED Prescriptions   None    PDMP not reviewed this encounter.   Rushie Chestnut,  PA-C 12/15/20  [id number]

## 2020-12-15 NOTE — ED Triage Notes (Signed)
Pt presents with c/o blood in urine, kidney pain, stomach pain, back pain and painful urination X 2 days.   Pt states she has not had a UTI before.

## 2021-05-17 ENCOUNTER — Other Ambulatory Visit: Payer: Self-pay

## 2021-05-17 ENCOUNTER — Encounter (HOSPITAL_COMMUNITY): Payer: Self-pay

## 2021-05-17 ENCOUNTER — Ambulatory Visit (HOSPITAL_COMMUNITY)
Admission: EM | Admit: 2021-05-17 | Discharge: 2021-05-17 | Disposition: A | Discharge status: Home / Self Care | Payer: Medicaid Other | Attending: Family Medicine | Admitting: Family Medicine

## 2021-05-17 DIAGNOSIS — M722 Plantar fascial fibromatosis: Secondary | ICD-10-CM | POA: Diagnosis not present

## 2021-05-17 NOTE — ED Provider Notes (Signed)
?[facility] ? ? ? ?CSN: [id number] ?Arrival date & time: 05/17/21  1318 ? ? ?  ? ?History   ?Chief Complaint ?Chief Complaint  ?Patient presents with  ? Foot Pain  ?  left  ? ? ?HPI ?Jamie Bautista is a 26 y.o. female.  ? ?Patient is here for left foot pain. Pain x 1 month, at the ball/heel of the foot.  When she stands up on it she has pain that goes up the leg.  Pain at the top of the foot as well at times.  Some numbness/tingling at times.  ? ? ?Past Medical History:  ?Diagnosis Date  ? Medical history non-contributory   ? ? ?Patient Active Problem List  ? Diagnosis Date Noted  ? Severe pre-eclampsia, with delivery 04/04/2017  ? Status post primary low transverse cesarean section 03/31/2017  ? Gestational hypertension 03/19/2017  ? Supervision of high risk pregnancy, antepartum 03/19/2017  ? Abnormal findings on antenatal screening   ? ? ?Past Surgical History:  ?Procedure Laterality Date  ? CESAREAN SECTION N/A 03/31/2017  ? Procedure: CESAREAN SECTION;  Surgeon: Marlow Baars, MD;  Location: [facility] BIRTHING SUIT[facility];  Service: Obstetrics;  Laterality: N/A;  ? NO PAST SURGERIES    ? ? ?OB History   ? ? Gravida  ?3  ? Para  ?0  ? Term  ?0  ? Preterm  ?0  ? AB  ?2  ? Living  ?0  ?  ? ? SAB  ?1  ? IAB  ?1  ? Ectopic  ?0  ? Multiple  ?0  ? Live Births  ?0  ?   ?  ?  ? ? ? ?Home Medications   ? ?Prior to Admission medications   ?Medication Sig Start Date End Date Taking? Authorizing Provider  ?ibuprofen (ADVIL,MOTRIN) 600 MG tablet Take 1 tablet (600 mg total) by mouth every 6 (six) hours as needed for mild pain or cramping. 04/03/17   [place] Bing, MD  ?NIFEdipine (PROCARDIA-XL/ADALAT CC) 30 MG 24 hr tablet Take 1 tablet (30 mg total) by mouth daily. 04/04/17   [place] Bing, MD  ?permethrin (ELIMITE) 5 % cream Apply to affected area once 10/27/19   Dahlia Byes A, NP  ?polyethylene glycol (MIRALAX / GLYCOLAX) packet Take 17 g by mouth daily. 04/03/17   [place] Bing, MD  ?Prenatal Vit-Fe Fumarate-FA (PRENATAL  VITAMIN PO) Take by mouth.    [provider]  ?simethicone (MYLICON) 80 MG chewable tablet Chew 1 tablet (80 mg total) by mouth 4 (four) times daily as needed for flatulence. 04/03/17   [place] Bing, MD  ? ? ?Family History ?Family History  ?Problem Relation Age of Onset  ? Heart failure Mother   ? Healthy Father   ? ? ?Social History ?Social History  ? ?Tobacco Use  ? Smoking status: Former  ?  Packs/day: 0.50  ?  Types: Cigarettes  ?  Quit date: 10/27/2016  ?  Years since quitting: 4.5  ? Smokeless tobacco: Never  ?Substance Use Topics  ? Alcohol use: No  ? Drug use: Yes  ?  Types: Marijuana  ?  Comment: none in pregnancy  ? ? ? ?Allergies   ?Patient has no known allergies. ? ? ?Review of Systems ?Review of Systems  ?Constitutional: Negative.   ?HENT: Negative.    ?Respiratory: Negative.    ?Cardiovascular: Negative.   ?Musculoskeletal:  Positive for gait problem.  ? ? ?Physical Exam ?Triage Vital Signs ?ED Triage Vitals  ?Enc Vitals  Group  ?   BP 05/17/21 1421 (!) 149/96  ?   Pulse Rate 05/17/21 1421 62  ?   Resp 05/17/21 1421 18  ?   Temp 05/17/21 1421 98.4 ?F (36.9 ?C)  ?   Temp Source 05/17/21 1421 Oral  ?   SpO2 05/17/21 1421 96 %  ?   Weight --   ?   Height --   ?   Head Circumference --   ?   Peak Flow --   ?   Pain Score 05/17/21 1418 9  ?   Pain Loc --   ?   Pain Edu? --   ?   Excl. in GC? --   ? ?No data found. ? ?Updated Vital Signs ?BP (!) 149/96 (BP Location: Left Arm)   Pulse 62   Temp 98.4 ?F (36.9 ?C) (Oral)   Resp 18   SpO2 96%   Breastfeeding No  ? ?Visual Acuity ?Right Eye Distance:   ?Left Eye Distance:   ?Bilateral Distance:   ? ?Right Eye Near:   ?Left Eye Near:    ?Bilateral Near:    ? ?Physical Exam ?Constitutional:   ?   Appearance: Normal appearance.  ?Musculoskeletal:  ?   Comments: Slight swelling to the left foot/ankle;  TTP to the left plantar heel;  pain with dorsiflexion of the left foot;   ?Neurological:  ?   Mental Status: She is alert.  ? ? ? ?UC Treatments /  Results  ?Labs ?(all labs ordered are listed, but only abnormal results are displayed) ?Labs Reviewed - No data to display ? ?EKG ? ? ?Radiology ?No results found. ? ?Procedures ?Procedures (including critical care time) ? ?Medications Ordered in UC ?Medications - No data to display ? ?Initial Impression / Assessment and Plan / UC Course  ?I have reviewed the triage vital signs and the nursing notes. ? ?Pertinent labs & imaging results that were available during my care of the patient were reviewed by me and considered in my medical decision making (see chart for details). ? ?  ?Final Clinical Impressions(s) / UC Diagnoses  ? ?Final diagnoses:  ?Plantar fasciitis of left foot  ? ? ? ?Discharge Instructions   ? ?  ?You were seen today for pain in the left foot, most consistent with plantar fasciitis.  I have given you home exercises for this.  ?I recommend a good shoe with good support.  Icing the foot and taking motrin 600mg  three times/day with food can be helpful.   ?If you continue to have pain despite home exercises, stretches and the above recommendations, then you will likely need to see podiatry for further care.  ? ? ? ?ED Prescriptions   ?None ?  ? ?PDMP not reviewed this encounter. ?  , MD ?05/17/21 1446 ? ?

## 2021-05-17 NOTE — ED Triage Notes (Signed)
One month h/o left foot pain that has worsened within the last week. Pt reports that she woke up one morning last week and when she stepped out of bed the pain suddenly increased with a new onset of swelling. Pt localizes pain to the left plantar surface. Some n/t in left foot when she wakes in the morning that resolves independently. ?Has been taking ibuprofen with temporary relief. No falls or injuries but Pt works long hours on her feet. Denies bruising.  ?

## 2021-05-17 NOTE — Discharge Instructions (Signed)
You were seen today for pain in the left foot, most consistent with plantar fasciitis.  I have given you home exercises for this.  ?I recommend a good shoe with good support.  Icing the foot and taking motrin 600mg  three times/day with food can be helpful.   ?If you continue to have pain despite home exercises, stretches and the above recommendations, then you will likely need to see podiatry for further care.  ?

## 2021-07-22 ENCOUNTER — Encounter (HOSPITAL_COMMUNITY): Payer: Self-pay | Admitting: *Deleted

## 2021-07-22 ENCOUNTER — Emergency Department (HOSPITAL_COMMUNITY): Payer: Medicaid Other

## 2021-07-22 ENCOUNTER — Other Ambulatory Visit: Payer: Self-pay

## 2021-07-22 ENCOUNTER — Emergency Department (HOSPITAL_COMMUNITY)
Admission: EM | Admit: 2021-07-22 | Discharge: 2021-07-22 | Disposition: A | Discharge status: Home / Self Care | Payer: Medicaid Other | Attending: Emergency Medicine | Admitting: Emergency Medicine

## 2021-07-22 DIAGNOSIS — Y9301 Activity, walking, marching and hiking: Secondary | ICD-10-CM | POA: Insufficient documentation

## 2021-07-22 DIAGNOSIS — W108XXA Fall (on) (from) other stairs and steps, initial encounter: Secondary | ICD-10-CM | POA: Diagnosis not present

## 2021-07-22 DIAGNOSIS — M25572 Pain in left ankle and joints of left foot: Secondary | ICD-10-CM | POA: Diagnosis not present

## 2021-07-22 DIAGNOSIS — S93402A Sprain of unspecified ligament of left ankle, initial encounter: Secondary | ICD-10-CM

## 2021-07-22 DIAGNOSIS — Z79899 Other long term (current) drug therapy: Secondary | ICD-10-CM | POA: Diagnosis not present

## 2021-07-22 DIAGNOSIS — M7989 Other specified soft tissue disorders: Secondary | ICD-10-CM | POA: Diagnosis not present

## 2021-07-22 DIAGNOSIS — S99912A Unspecified injury of left ankle, initial encounter: Secondary | ICD-10-CM | POA: Diagnosis present

## 2021-07-22 DIAGNOSIS — M25472 Effusion, left ankle: Secondary | ICD-10-CM | POA: Diagnosis not present

## 2021-07-22 LAB — I-STAT CHEM 8, ED
BUN: 12 mg/dL (ref 6–20)
Calcium, Ion: 1.07 mmol/L — ABNORMAL LOW (ref 1.15–1.40)
Chloride: 105 mmol/L (ref 98–111)
Creatinine, Ser: 0.9 mg/dL (ref 0.44–1.00)
Glucose, Bld: 90 mg/dL (ref 70–99)
HCT: 39 % (ref 36.0–46.0)
Hemoglobin: 13.3 g/dL (ref 12.0–15.0)
Potassium: 4 mmol/L (ref 3.5–5.1)
Sodium: 137 mmol/L (ref 135–145)
TCO2: 23 mmol/L (ref 22–32)

## 2021-07-22 LAB — I-STAT BETA HCG BLOOD, ED (MC, WL, AP ONLY): I-stat hCG, quantitative: <5 m[IU]/mL (ref <5)

## 2021-07-22 MED ORDER — HYDROCODONE-ACETAMINOPHEN 5-325 MG PO TABS
1.0000 | ORAL_TABLET | Freq: Once | ORAL | Status: AC
  Administered 2021-07-22: 1 via ORAL
  Filled 2021-07-22: qty 1

## 2021-07-22 NOTE — ED Provider Notes (Signed)
?[facility] EMERGENCY DEPARTMENT ?Provider Note ? ? ?CSN: [id number] ?Arrival date & time: 07/22/21  0254 ? ?  ? ?History ? ?Chief Complaint  ?Patient presents with  ? Foot Pain  ? ? ?Jamie Bautista is a 26 y.o. female. ? ? ?Foot Pain ? ? ?26 year old female presenting to the emergency department with left ankle pain after a fall.  The patient states that she was drinking and was intoxicated and stumbled up the stairs, landing awkwardly on her left ankle.  Since then she has had pain in the lateral aspect of her ankle, worse with range of motion and weightbearing.  She denies any head trauma, loss of consciousness or any other traumatic injuries.  She arrived to emergency department GCS 15, ABC intact, not clinically intoxicated. ? ?Home Medications ?Prior to Admission medications   ?Medication Sig Start Date End Date Taking? Authorizing Provider  ?ibuprofen (ADVIL,MOTRIN) 600 MG tablet Take 1 tablet (600 mg total) by mouth every 6 (six) hours as needed for mild pain or cramping. 04/03/17   Roosevelt Bing, MD  ?NIFEdipine (PROCARDIA-XL/ADALAT CC) 30 MG 24 hr tablet Take 1 tablet (30 mg total) by mouth daily. 04/04/17   Ashmore Bing, MD  ?permethrin (ELIMITE) 5 % cream Apply to affected area once 10/27/19   Dahlia Byes A, NP  ?polyethylene glycol (MIRALAX / GLYCOLAX) packet Take 17 g by mouth daily. 04/03/17   [place]odge Bing, MD  ?Prenatal Vit-Fe Fumarate-FA (PRENATAL VITAMIN PO) Take by mouth.    [provider]  ?simethicone (MYLICON) 80 MG chewable tablet Chew 1 tablet (80 mg total) by mouth 4 (four) times daily as needed for flatulence. 04/03/17   Holmen Bing, MD  ?   ? ?Allergies    ?Patient has no known allergies.   ? ?Review of Systems   ?Review of Systems  ?All other systems reviewed and are negative. ? ?Physical Exam ?Updated Vital Signs ?BP 109/64 (BP Location: Right Arm)   Pulse 73   Temp 98.2 ?F (36.8 ?C) (Oral)   Resp 18   Ht 5\' 3"  (1.6 m)   Wt 88 kg   LMP  06/27/2021   SpO2 97%   BMI 34.37 kg/m?  ?Physical Exam ?Vitals and nursing note reviewed.  ?Constitutional:   ?   General: She is not in acute distress. ?   Appearance: She is well-developed.  ?   Comments: GCS 15, ABC intact  ?HENT:  ?   Head: Normocephalic and atraumatic.  ?Eyes:  ?   Extraocular Movements: Extraocular movements intact.  ?   Conjunctiva/sclera: Conjunctivae normal.  ?   Pupils: Pupils are equal, round, and reactive to light.  ?Neck:  ?   Comments: No midline tenderness to palpation of the cervical spine.  Range of motion intact ?Cardiovascular:  ?   Rate and Rhythm: Normal rate and regular rhythm.  ?Pulmonary:  ?   Effort: Pulmonary effort is normal. No respiratory distress.  ?Chest:  ?   Comments: Clavicles stable nontender to AP compression.  Chest wall stable and nontender to AP and lateral compression. ?Abdominal:  ?   Palpations: Abdomen is soft.  ?   Tenderness: There is no abdominal tenderness.  ?Musculoskeletal:  ?   Cervical back: Neck supple.  ?   Comments: No midline tenderness to palpation of the thoracic or lumbar spine.  Extremities atraumatic with intact range of motion with the exception of TTP of the lateral malleolus, no TTP of the medial malleolus, no TTP of  the metatarsals, 2+ DP pulses  ?Skin: ?   General: Skin is warm and dry.  ?Neurological:  ?   General: No focal deficit present.  ?   Mental Status: She is alert. Mental status is at baseline.  ? ? ?ED Results / Procedures / Treatments   ?Labs ?(all labs ordered are listed, but only abnormal results are displayed) ?Labs Reviewed  ?I-STAT BETA HCG BLOOD, ED ([facility], [facility], [facility] ONLY)  ? ? ?EKG ?None ? ?Radiology ?DG Ankle Complete Left ? ?Result Date: 07/22/2021 ?CLINICAL DATA:  Fall with swelling to lateral left ankle. EXAM: LEFT ANKLE COMPLETE - 3+ VIEW COMPARISON:  None Available. FINDINGS: A bony density is present inferior to the medial malleolus. The remaining bony structures are intact. No dislocation. Mild calcaneal spurring  is noted. Soft tissue swelling is present about the ankle. IMPRESSION: Bony density inferior to the medial malleolus, possible avulsion fracture of indeterminate age. Correlation with physical exam for point tenderness is suggested. The remaining bony structures are intact. Electronically Signed   By: Thornell Sartorius M.D.   On: 07/22/2021 03:38  ? ?CT Ankle Left Wo Contrast ? ?Result Date: 07/22/2021 ?CLINICAL DATA:  Ankle injury walking up steps.  Pain. EXAM: CT OF THE LEFT ANKLE WITHOUT CONTRAST TECHNIQUE: Multidetector CT imaging of the left ankle was performed according to the standard protocol. Multiplanar CT image reconstructions were also generated. RADIATION DOSE REDUCTION: This exam was performed according to the departmental dose-optimization program which includes automated exposure control, adjustment of the mA and/or kV according to patient size and/or use of iterative reconstruction technique. COMPARISON:  Ankle x-ray from earlier same day FINDINGS: Bones/Joint/Cartilage No evidence for an acute fracture. No subluxation or dislocation. Tiny fragment identified on x-ray adjacent to the medial malleolus is well corticated and likely reflects a chronic avulsion injury. Tiny osseous fragments anterior to the distal tibia at the tibiotalar joint also have chronic appearance and may reflect remote or chronic trauma. There is a small effusion at the tibiotalar joint. Ligaments Suboptimally assessed by CT. Muscles and Tendons Unremarkable. Soft tissues No evidence for soft tissue gas. No evidence for unexpected radiopaque soft tissue foreign body. IMPRESSION: 1. No evidence for an acute fracture or subluxation. 2. Tiny osseous fragments identified adjacent to the medial malleolus and anterior distal tibia at the tibiotalar joint have chronic features, likely reflecting remote or chronic trauma. 3. Small tibiotalar joint effusion. Electronically Signed   By: Kennith Center M.D.   On: 07/22/2021 08:26    ? ?Procedures ?Procedures  ? ? ?Medications Ordered in ED ?Medications  ?HYDROcodone-acetaminophen (NORCO/VICODIN) 5-325 MG per tablet 1 tablet (1 tablet Oral Given 07/22/21 0826)  ? ? ?ED Course/ Medical Decision Making/ A&P ?  ?                        ?Medical Decision Making ?Amount and/or Complexity of Data Reviewed ?Radiology: ordered. ? ?Risk ?Prescription drug management. ? ? ?26 year old female presenting to the emergency department with left ankle pain after a fall.  The patient states that she was drinking and was intoxicated and stumbled up the stairs, landing awkwardly on her left ankle.  Since then she has had pain in the lateral aspect of her ankle, worse with range of motion and weightbearing.  She denies any head trauma, loss of consciousness or any other traumatic injuries.  She arrived to emergency department GCS 15, ABC intact, not clinically intoxicated. ? ?Vitals stable on arrival. NSR noted  on telemetry. Physical exam unremarkable with the exception of TTP of the lateral malleolus, no TTP of the medial malleolus, no TTP of the metatarsals, 2+ DP pulses. ? ?XR imaging of the ankle significant for the following: ?   ?IMPRESSION:  ?Bony density inferior to the medial malleolus, possible avulsion  ?fracture of indeterminate age. Correlation with physical exam for  ?point tenderness is suggested. The remaining bony structures are  ?intact.  ? ? ?To further evaluate, CT imaging of the left ankle was obtained. ?IMPRESSION:  ?1. No evidence for an acute fracture or subluxation.  ?2. Tiny osseous fragments identified adjacent to the medial  ?malleolus and anterior distal tibia at the tibiotalar joint have  ?chronic features, likely reflecting remote or chronic trauma.  ?3. Small tibiotalar joint effusion.  ? ?The patient was placed in an Ace wrap, provided crutches, advised weightbearing as tolerated, NSAIDs, rest, ice, elevation of the extremity and follow-up with sports medicine as needed.  Overall  stable for discharge. ? ?Final Clinical Impression(s) / ED Diagnoses ?Final diagnoses:  ?Sprain of left ankle, unspecified ligament, initial encounter  ? ? ?Rx / DC Orders ?ED Discharge Orders   ? ?      Ordered  ?  AMB referral to

## 2021-07-22 NOTE — ED Triage Notes (Signed)
The pt injured her lt foot walking up some steps yesterday she has pain in her lt foot  lmp 3weeks ago ?

## 2021-07-22 NOTE — Discharge Instructions (Addendum)
Your CT imaging did not reveal evidence of an acute fracture.  Results are as below: ?IMPRESSION:  ?1. No evidence for an acute fracture or subluxation.  ?2. Tiny osseous fragments identified adjacent to the medial  ?malleolus and anterior distal tibia at the tibiotalar joint have  ?chronic features, likely reflecting remote or chronic trauma.  ?3. Small tibiotalar joint effusion.  ?   ? ?Recommend rest, ice/heat, immobilization with an ace wrap, crutches and weight bearing as tolerated, NSAIDs for pain control. Follow-up with sports medicine for a reassessment outpatient. ?

## 2021-12-19 ENCOUNTER — Encounter (HOSPITAL_COMMUNITY): Payer: Self-pay | Admitting: Emergency Medicine

## 2021-12-19 ENCOUNTER — Ambulatory Visit (HOSPITAL_COMMUNITY)
Admission: EM | Admit: 2021-12-19 | Discharge: 2021-12-19 | Disposition: A | Discharge status: Home / Self Care | Payer: Medicaid Other | Attending: Sports Medicine | Admitting: Sports Medicine

## 2021-12-19 DIAGNOSIS — K029 Dental caries, unspecified: Secondary | ICD-10-CM

## 2021-12-19 MED ORDER — AMOXICILLIN-POT CLAVULANATE 875-125 MG PO TABS: 1.0000 | ORAL_TABLET | Freq: Two times a day (BID) | ORAL | 0 refills | Status: DC

## 2021-12-19 MED ORDER — TRAMADOL HCL 50 MG PO TABS: 50.0000 mg | ORAL_TABLET | Freq: Four times a day (QID) | ORAL | 0 refills | Status: AC | PRN

## 2021-12-19 NOTE — Discharge Instructions (Addendum)
You need to see a dentist ASAP to get this taken care of! I have called in antibiotic and pain medicine for 3 days

## 2021-12-19 NOTE — ED Triage Notes (Signed)
Pt presents with right lower dental pain since Last Friday. States has been using Tylenol with no relief and crushing Ibuprofen placing it directly on tooth for pain relief.

## 2021-12-19 NOTE — ED Provider Notes (Signed)
[place]   [id number] 12/19/21 Arrival Time: 6644  ASSESSMENT & PLAN:  1. Pain due to dental caries    -Patient with multiple dental caries, most significantly in the right lower molar.  There are no obvious signs of infection at this time but will prescribe Augmentin as prophylactic measure.  For her pain, will prescribe 3 days of tramadol for acute pain exception.  PDMP personally reviewed by myself today during the visit.  Advised that she make an appointment with a dentist ASAP to have this addressed further.  All questions answered she agrees to plan.  Meds ordered this encounter  Medications   amoxicillin-clavulanate (AUGMENTIN) 875-125 MG tablet    Sig: Take 1 tablet by mouth every 12 (twelve) hours.    Dispense:  14 tablet    Refill:  0   traMADol (ULTRAM) 50 MG tablet    Sig: Take 1 tablet (50 mg total) by mouth every 6 (six) hours as needed for up to 3 days.    Dispense:  12 tablet    Refill:  0     Discharge Instructions      You need to see a dentist ASAP to get this taken care of! I have called in antibiotic and pain medicine for 3 days      Follow-up Information     Schedule an appointment as soon as possible for a visit  with [facility] [place].   Specialty: Dentistry Contact information: [place] [phone] [place] [phone]                 Reviewed expectations re: course of current medical issues. Questions answered. Outlined signs and symptoms indicating need for more acute intervention. Patient verbalized understanding. After Visit Summary given.   SUBJECTIVE: Right pleasant 41 old female comes to urgent care to be evaluated for right-sided tooth pain.  She has a painful right lower molar.  This pain has been constant for about a week.  She admits she has poor dental hygiene and has had poor dental hygiene for many years.  She does not see dentist regularly.  She has tried  Tylenol and ibuprofen and Orajel but still in pain.  She reports her right side of her face is been intermittently swollen.  Denies any fevers, discharge from the tooth, difficulty breathing.  No LMP recorded. Past Surgical History:  Procedure Laterality Date   CESAREAN SECTION N/A 03/31/2017   Procedure: CESAREAN SECTION;  Surgeon: Jerelyn Charles, MD;  Location: [facility];  Service: Obstetrics;  Laterality: N/A;   NO PAST SURGERIES       OBJECTIVE:  Vitals:   12/19/21 0838  BP: 127/81  Pulse: 92  Resp: 18  Temp: 98.3 F (36.8 C)  TempSrc: Oral  SpO2: 96%     Physical Exam Vitals reviewed.  Constitutional:      Appearance: Normal appearance. She is not ill-appearing.  HENT:     Head: Normocephalic.     Right Ear: Tympanic membrane normal.     Left Ear: Tympanic membrane normal.     Mouth/Throat:     Mouth: Mucous membranes are moist.     Dentition: Abnormal dentition. Dental caries present. No gingival swelling or dental abscesses.     Pharynx: No oropharyngeal exudate or posterior oropharyngeal erythema.      Comments: No obvious abscess or purulence     Labs: Results for orders placed or performed during the hospital encounter of 07/22/21  I-Stat Beta  hCG blood, ED ([facility], [facility], [facility] only)  Result Value Ref Range   I-stat hCG, quantitative <5.0 <5 mIU/mL   Comment 3          I-stat chem 8, ED (not at [facility] or [facility])  Result Value Ref Range   Sodium 137 135 - 145 mmol/L   Potassium 4.0 3.5 - 5.1 mmol/L   Chloride 105 98 - 111 mmol/L   BUN 12 6 - 20 mg/dL   Creatinine, Ser 7.65 0.44 - 1.00 mg/dL   Glucose, Bld 90 70 - 99 mg/dL   Calcium, Ion 4.65 (L) 1.15 - 1.40 mmol/L   TCO2 23 22 - 32 mmol/L   Hemoglobin 13.3 12.0 - 15.0 g/dL   HCT 03.5 46.5 - 68.1 %   Labs Reviewed - No data to display  Imaging: No results found.   No Known Allergies                                             Past Medical History:  Diagnosis Date   Medical history  non-contributory     Social History   Socioeconomic History   Marital status: Single    Spouse name: Not on file   Number of children: Not on file   Years of education: Not on file   Highest education level: Not on file  Occupational History   Not on file  Tobacco Use   Smoking status: Former    Packs/day: 0.50    Types: Cigarettes    Quit date: 10/27/2016    Years since quitting: 5.1   Smokeless tobacco: Never  Substance and Sexual Activity   Alcohol use: No   Drug use: Yes    Types: Marijuana    Comment: none in pregnancy   Sexual activity: Not Currently    Birth control/protection: None  Other Topics Concern   Not on file  Social History Narrative   Not on file   Social Determinants of Health   Financial Resource Strain: Not on file  Food Insecurity: Not on file  Transportation Needs: Not on file  Physical Activity: Not on file  Stress: Not on file  Social Connections: Not on file  Intimate Partner Violence: Not on file    Family History  Problem Relation Age of Onset   Heart failure Mother    Healthy Father       Carlisia Geno, Baldemar Friday, MD 12/19/21 [phone]

## 2022-01-10 DIAGNOSIS — Z30430 Encounter for insertion of intrauterine contraceptive device: Secondary | ICD-10-CM | POA: Diagnosis not present

## 2022-01-10 DIAGNOSIS — Z3202 Encounter for pregnancy test, result negative: Secondary | ICD-10-CM | POA: Diagnosis not present

## 2022-08-06 IMAGING — CR DG ANKLE COMPLETE 3+V*L*
3 series · 3 of 3 positions shown · non-contrast
Comparison: None Available.

CLINICAL DATA: Fall with swelling to lateral left ankle.

EXAM:
LEFT ANKLE COMPLETE - 3+ VIEW

[ankle ap]
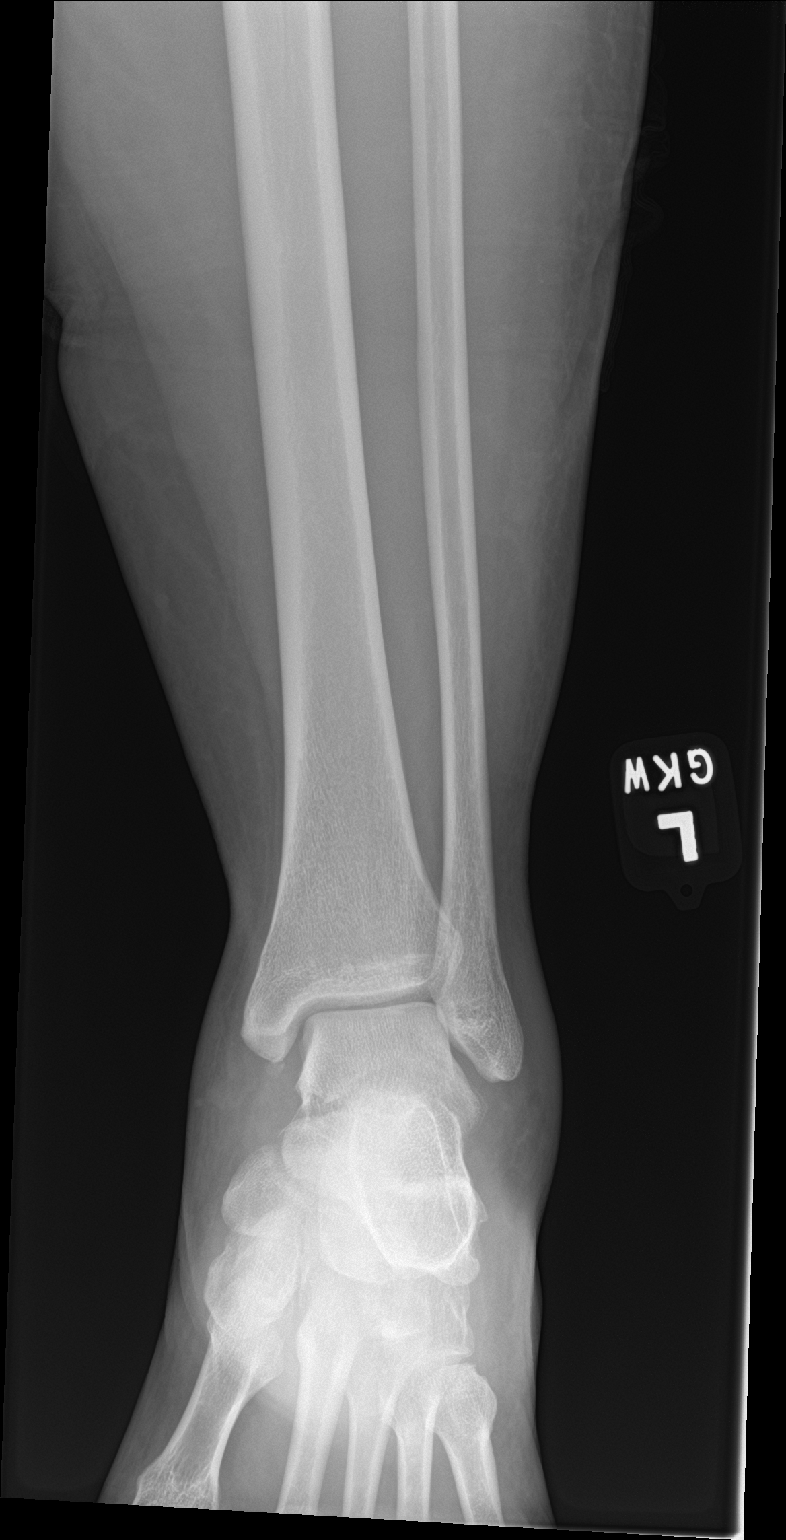

[ankle obl]
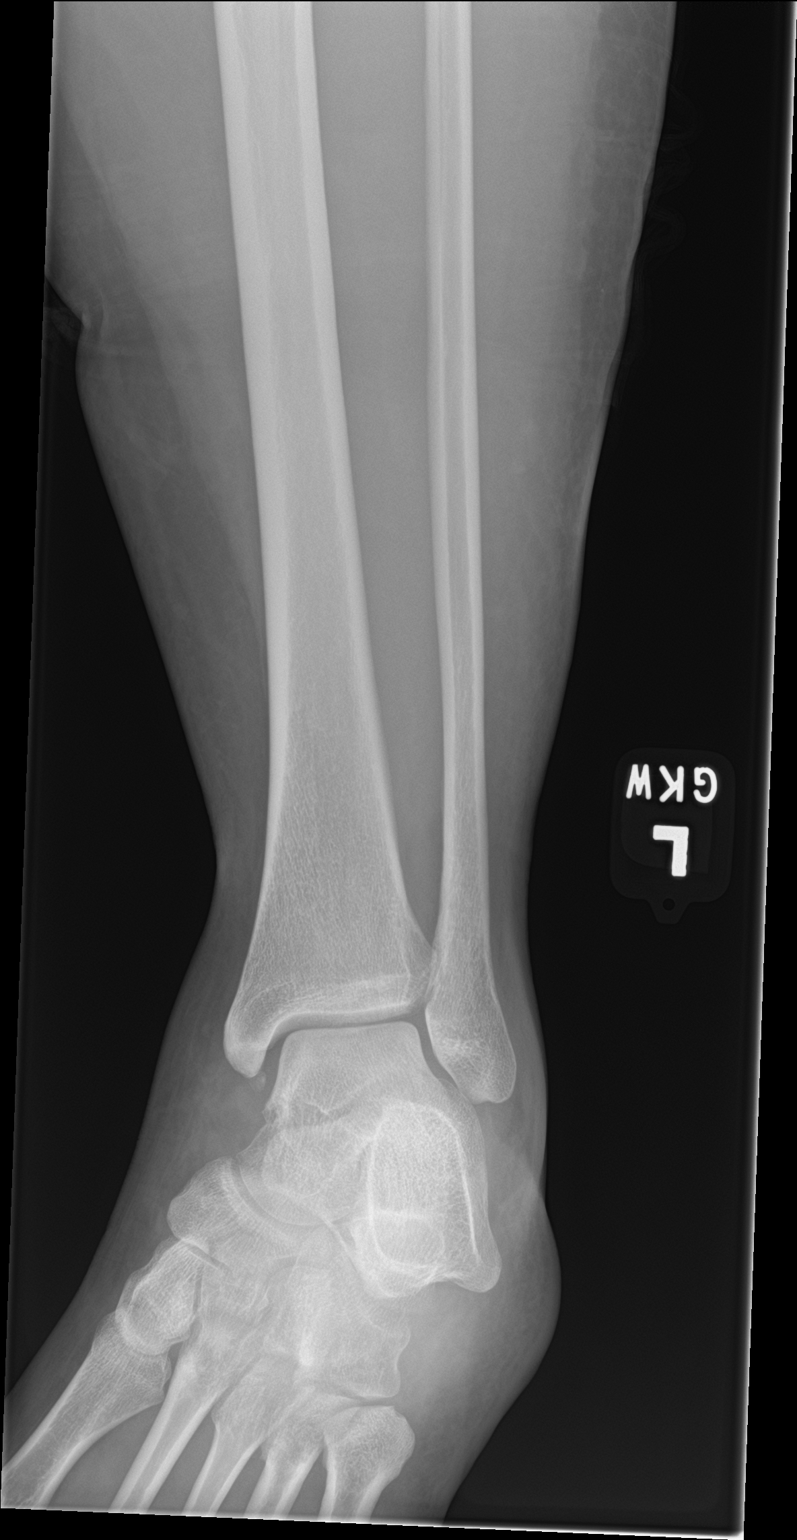

[ankle lat]
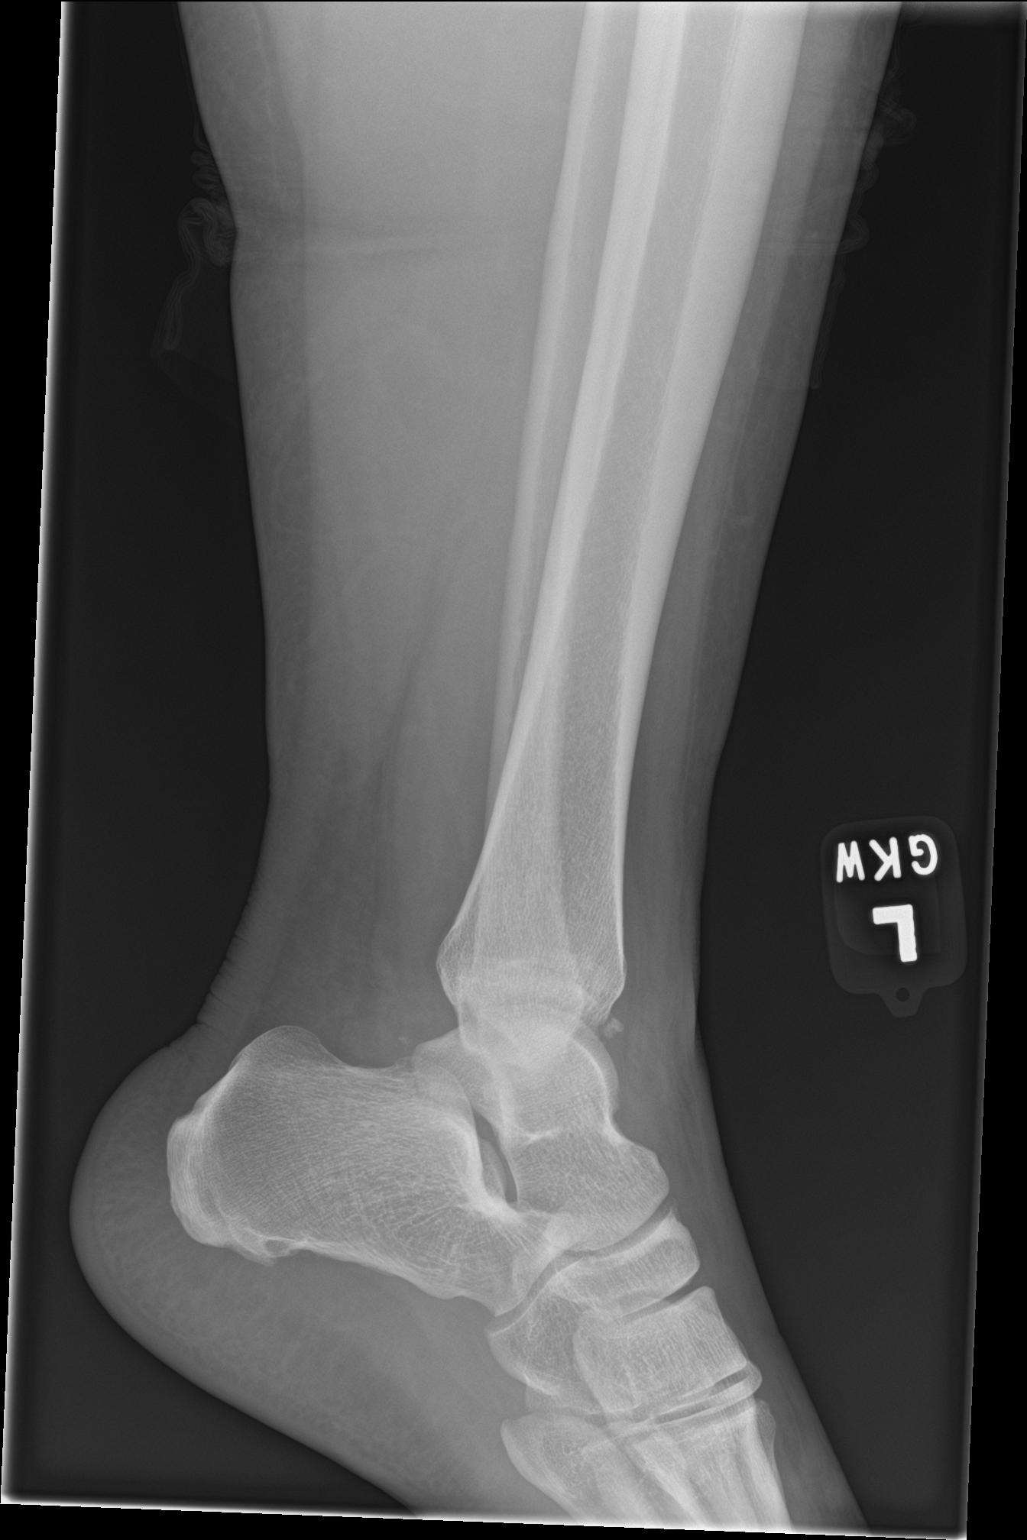

[3 of 3 positions shown; findings below may reference images not displayed]

FINDINGS: A bony density is present inferior to the medial malleolus. The
remaining bony structures are intact. No dislocation. Mild calcaneal
spurring is noted. Soft tissue swelling is present about the ankle.
IMPRESSION: Bony density inferior to the medial malleolus, possible avulsion
fracture of indeterminate age. Correlation with physical exam for
point tenderness is suggested. The remaining bony structures are
intact.

## 2023-06-14 ENCOUNTER — Emergency Department (HOSPITAL_COMMUNITY)

## 2023-06-14 ENCOUNTER — Encounter (HOSPITAL_COMMUNITY): Payer: Self-pay | Admitting: Emergency Medicine

## 2023-06-14 ENCOUNTER — Other Ambulatory Visit: Payer: Self-pay

## 2023-06-14 ENCOUNTER — Emergency Department (HOSPITAL_COMMUNITY)
Admission: EM | Admit: 2023-06-14 | Discharge: 2023-06-14 | Disposition: A | Discharge status: Home / Self Care | Attending: Emergency Medicine | Admitting: Emergency Medicine

## 2023-06-14 DIAGNOSIS — M25571 Pain in right ankle and joints of right foot: Secondary | ICD-10-CM | POA: Diagnosis present

## 2023-06-14 DIAGNOSIS — S82431A Displaced oblique fracture of shaft of right fibula, initial encounter for closed fracture: Secondary | ICD-10-CM | POA: Diagnosis not present

## 2023-06-14 DIAGNOSIS — S82831A Other fracture of upper and lower end of right fibula, initial encounter for closed fracture: Secondary | ICD-10-CM | POA: Insufficient documentation

## 2023-06-14 DIAGNOSIS — W108XXA Fall (on) (from) other stairs and steps, initial encounter: Secondary | ICD-10-CM | POA: Diagnosis not present

## 2023-06-14 MED ORDER — OXYCODONE HCL 5 MG PO TABS: 5.0000 mg | ORAL_TABLET | Freq: Four times a day (QID) | ORAL | 0 refills | Status: AC | PRN

## 2023-06-14 MED ORDER — OXYCODONE-ACETAMINOPHEN 5-325 MG PO TABS
1.0000 | ORAL_TABLET | Freq: Once | ORAL | Status: AC
  Administered 2023-06-14: 1 via ORAL
  Filled 2023-06-14: qty 1
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
## 2023-06-14 NOTE — Progress Notes (Signed)
 Orthopedic Tech Progress Note Patient Details:  Jamie Bautista 1995-05-11 [id number]  Ortho Devices Type of Ortho Device: CAM walker, Crutches Ortho Device/Splint Location: RLE Ortho Device/Splint Interventions: Application, Adjustment   Post Interventions Patient Tolerated: Well  [name] [name] 06/14/2023, 7:48 AM

## 2023-06-14 NOTE — Discharge Instructions (Addendum)
 It was a pleasure caring for you today. Please reach out the the ortho provider immediately after discharge for follow up appointment. Seek emergency care if experiencing any new or worsening symptoms.  Alternating between 650 mg Tylenol and 400 mg Advil: The best way to alternate taking Acetaminophen (example Tylenol) and Ibuprofen (example Advil/Motrin) is to take them 3 hours apart. For example, if you take ibuprofen at 6 am you can then take Tylenol at 9 am. You can continue this regimen throughout the day, making sure you do not exceed the recommended maximum dose for each drug.  I am also providing you with oxy for breakthrough pain.
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
## 2023-06-17 ENCOUNTER — Encounter (HOSPITAL_COMMUNITY): Payer: Self-pay | Admitting: Orthopedic Surgery

## 2023-06-17 ENCOUNTER — Other Ambulatory Visit: Payer: Self-pay

## 2023-06-17 DIAGNOSIS — M25571 Pain in right ankle and joints of right foot: Secondary | ICD-10-CM | POA: Diagnosis not present

## 2023-06-17 NOTE — Progress Notes (Signed)
 Surgery orders requested via Epic inbox.

## 2023-06-17 NOTE — Anesthesia Preprocedure Evaluation (Signed)
 Anesthesia Evaluation  Patient identified by MRN, date of birth, ID band Patient awake    Reviewed: Allergy & Precautions, NPO status , Patient's Chart, lab work & pertinent test results  History of Anesthesia Complications Negative for: history of anesthetic complications  Airway Mallampati: II  TM Distance: >3 FB Neck ROM: Full    Dental no notable dental hx.    Pulmonary Patient abstained from smoking., former smoker   Pulmonary exam normal        Cardiovascular hypertension, Normal cardiovascular exam     Neuro/Psych negative neurological ROS     GI/Hepatic negative GI ROS,,,(+)     substance abuse  marijuana use  Endo/Other  BMI 39  Renal/GU negative Renal ROS     Musculoskeletal Right ankle fracture   Abdominal   Peds  Hematology negative hematology ROS (+)   Anesthesia Other Findings Day of surgery medications reviewed with patient.  Reproductive/Obstetrics                              Anesthesia Physical Anesthesia Plan  ASA: 2  Anesthesia Plan: General   Post-op Pain Management: Tylenol PO (pre-op)* and Regional block*   Induction: Intravenous  PONV Risk Score and Plan: 3 and Treatment may vary due to age or medical condition, Ondansetron, Dexamethasone, Midazolam and Scopolamine patch - Pre-op  Airway Management Planned: LMA  Additional Equipment: None  Intra-op Plan:   Post-operative Plan: Extubation in OR  Informed Consent: I have reviewed the patients History and Physical, chart, labs and discussed the procedure including the risks, benefits and alternatives for the proposed anesthesia with the patient or authorized representative who has indicated his/her understanding and acceptance.     Dental advisory given  Plan Discussed with: CRNA  Anesthesia Plan Comments:         Anesthesia Quick Evaluation

## 2023-06-17 NOTE — Progress Notes (Signed)
 COVID Vaccine Completed:  Date of COVID positive in last 90 days:  No  PCP - No PCP Cardiologist -  N/A  Chest x-ray - N/A EKG - N/A Stress Test - N/A ECHO - N/A Cardiac Cath - N/A Pacemaker/ICD device last checked: Spinal Cord Stimulator:N/A  Bowel Prep -  N/A  Sleep Study - N/A CPAP -   Fasting Blood Sugar - N/A Checks Blood Sugar _____ times a day  Last dose of GLP1 agonist-  N/A GLP1 instructions:  Hold 7 days before surgery    Last dose of SGLT-2 inhibitors-  N/A SGLT-2 instructions:  Hold 3 days before surgery    Blood Thinner Instructions:  N/A Aspirin Instructions: Last Dose:  Activity level:  Prior to injury able to go up a flight of stairs and perform activities of daily living without stopping and without symptoms of chest pain or shortness of breath.  Anesthesia review: N/A  Patient denies shortness of breath, fever, cough and chest pain at PAT appointment (completed over the phone)  Patient verbalized understanding of instructions that were given to them at the PAT appointment. Patient was also instructed that they will need to review over the PAT instructions again at home before surgery.

## 2023-06-18 ENCOUNTER — Ambulatory Visit (HOSPITAL_COMMUNITY): Payer: Self-pay | Admitting: Anesthesiology

## 2023-06-18 ENCOUNTER — Other Ambulatory Visit: Payer: Self-pay

## 2023-06-18 ENCOUNTER — Ambulatory Visit (HOSPITAL_COMMUNITY)
Admission: RE | Admit: 2023-06-18 | Discharge: 2023-06-18 | Disposition: A | Discharge status: Home / Self Care | Attending: Orthopedic Surgery | Admitting: Orthopedic Surgery

## 2023-06-18 ENCOUNTER — Encounter (HOSPITAL_COMMUNITY)
Admission: RE | Disposition: A | Discharge status: Home / Self Care | Payer: Self-pay | Source: Home / Self Care | Attending: Orthopedic Surgery

## 2023-06-18 ENCOUNTER — Ambulatory Visit (HOSPITAL_COMMUNITY)

## 2023-06-18 ENCOUNTER — Encounter (HOSPITAL_COMMUNITY): Payer: Self-pay | Admitting: Orthopedic Surgery

## 2023-06-18 DIAGNOSIS — S93431A Sprain of tibiofibular ligament of right ankle, initial encounter: Secondary | ICD-10-CM | POA: Diagnosis not present

## 2023-06-18 DIAGNOSIS — X501XXA Overexertion from prolonged static or awkward postures, initial encounter: Secondary | ICD-10-CM | POA: Insufficient documentation

## 2023-06-18 DIAGNOSIS — Z01818 Encounter for other preprocedural examination: Secondary | ICD-10-CM

## 2023-06-18 DIAGNOSIS — F1290 Cannabis use, unspecified, uncomplicated: Secondary | ICD-10-CM | POA: Diagnosis not present

## 2023-06-18 DIAGNOSIS — G8918 Other acute postprocedural pain: Secondary | ICD-10-CM | POA: Diagnosis not present

## 2023-06-18 DIAGNOSIS — Z9889 Other specified postprocedural states: Secondary | ICD-10-CM | POA: Diagnosis not present

## 2023-06-18 DIAGNOSIS — S8261XA Displaced fracture of lateral malleolus of right fibula, initial encounter for closed fracture: Secondary | ICD-10-CM | POA: Insufficient documentation

## 2023-06-18 DIAGNOSIS — F17290 Nicotine dependence, other tobacco product, uncomplicated: Secondary | ICD-10-CM | POA: Insufficient documentation

## 2023-06-18 DIAGNOSIS — I10 Essential (primary) hypertension: Secondary | ICD-10-CM | POA: Diagnosis not present

## 2023-06-18 DIAGNOSIS — S82831A Other fracture of upper and lower end of right fibula, initial encounter for closed fracture: Secondary | ICD-10-CM | POA: Diagnosis not present

## 2023-06-18 DIAGNOSIS — S82401A Unspecified fracture of shaft of right fibula, initial encounter for closed fracture: Secondary | ICD-10-CM | POA: Diagnosis not present

## 2023-06-18 HISTORY — PX: ORIF ANKLE FRACTURE: SHX5408

## 2023-06-18 HISTORY — DX: Unspecified pre-eclampsia, unspecified trimester: O14.90

## 2023-06-18 LAB — POCT PREGNANCY, URINE: Preg Test, Ur: NEGATIVE

## 2023-06-18 SURGERY — OPEN REDUCTION INTERNAL FIXATION (ORIF) ANKLE FRACTURE
Anesthesia: General | Site: Ankle | Laterality: Right

## 2023-06-18 MED ORDER — ASPIRIN 81 MG PO TBEC
81.0000 mg | DELAYED_RELEASE_TABLET | Freq: Two times a day (BID) | ORAL | Status: AC
Start: 2023-06-19 — End: 2023-07-17

## 2023-06-18 MED ORDER — VANCOMYCIN HCL 1000 MG IV SOLR
INTRAVENOUS | Status: AC
  Filled 2023-06-18: qty 20

## 2023-06-18 MED ORDER — CEFAZOLIN SODIUM-DEXTROSE 2-4 GM/100ML-% IV SOLN
2.0000 g | INTRAVENOUS | Status: AC
  Administered 2023-06-18: 2 g via INTRAVENOUS
  Filled 2023-06-18: qty 100

## 2023-06-18 MED ORDER — CLONIDINE HCL (ANALGESIA) 100 MCG/ML EP SOLN
EPIDURAL | Status: DC | PRN
  Administered 2023-06-18: 67 ug
  Administered 2023-06-18: 33 ug

## 2023-06-18 MED ORDER — LACTATED RINGERS IV SOLN: INTRAVENOUS | Status: DC

## 2023-06-18 MED ORDER — PROPOFOL 10 MG/ML IV BOLUS
INTRAVENOUS | Status: DC | PRN
  Administered 2023-06-18: 200 mg via INTRAVENOUS

## 2023-06-18 MED ORDER — DEXAMETHASONE SODIUM PHOSPHATE 10 MG/ML IJ SOLN
INTRAMUSCULAR | Status: AC
  Filled 2023-06-18: qty 1

## 2023-06-18 MED ORDER — FENTANYL CITRATE PF 50 MCG/ML IJ SOSY
PREFILLED_SYRINGE | INTRAMUSCULAR | Status: AC
  Filled 2023-06-18: qty 2

## 2023-06-18 MED ORDER — FENTANYL CITRATE (PF) 100 MCG/2ML IJ SOLN
INTRAMUSCULAR | Status: AC
  Filled 2023-06-18: qty 2

## 2023-06-18 MED ORDER — METHOCARBAMOL 500 MG PO TABS: 500.0000 mg | ORAL_TABLET | Freq: Three times a day (TID) | ORAL | 0 refills | Status: AC | PRN

## 2023-06-18 MED ORDER — NAPROXEN 500 MG PO TBEC: 500.0000 mg | DELAYED_RELEASE_TABLET | Freq: Two times a day (BID) | ORAL | 0 refills | Status: AC

## 2023-06-18 MED ORDER — FENTANYL CITRATE (PF) 100 MCG/2ML IJ SOLN
INTRAMUSCULAR | Status: DC | PRN
Start: 2023-06-18 — End: 2023-06-18
  Administered 2023-06-18: 100 ug via INTRAVENOUS

## 2023-06-18 MED ORDER — EPHEDRINE 5 MG/ML INJ
INTRAVENOUS | Status: AC
  Filled 2023-06-18: qty 5

## 2023-06-18 MED ORDER — EPHEDRINE SULFATE-NACL 50-0.9 MG/10ML-% IV SOSY
PREFILLED_SYRINGE | INTRAVENOUS | Status: DC | PRN
Start: 2023-06-18 — End: 2023-06-18
  Administered 2023-06-18: 5 mg via INTRAVENOUS

## 2023-06-18 MED ORDER — DROPERIDOL 2.5 MG/ML IJ SOLN: 0.6250 mg | Freq: Once | INTRAMUSCULAR | Status: DC | PRN

## 2023-06-18 MED ORDER — OXYCODONE HCL 5 MG PO TABS
ORAL_TABLET | ORAL | Status: AC
  Filled 2023-06-18: qty 1

## 2023-06-18 MED ORDER — FENTANYL CITRATE PF 50 MCG/ML IJ SOSY: 25.0000 ug | PREFILLED_SYRINGE | INTRAMUSCULAR | Status: DC | PRN

## 2023-06-18 MED ORDER — ONDANSETRON HCL 4 MG/2ML IJ SOLN
INTRAMUSCULAR | Status: AC
  Filled 2023-06-18: qty 2

## 2023-06-18 MED ORDER — OXYCODONE HCL 5 MG PO TABS: 5.0000 mg | ORAL_TABLET | Freq: Once | ORAL | Status: DC | PRN

## 2023-06-18 MED ORDER — TRANEXAMIC ACID-NACL 1000-0.7 MG/100ML-% IV SOLN
1000.0000 mg | INTRAVENOUS | Status: AC
  Administered 2023-06-18: 1000 mg via INTRAVENOUS
  Filled 2023-06-18: qty 100

## 2023-06-18 MED ORDER — VANCOMYCIN HCL 1000 MG IV SOLR
INTRAVENOUS | Status: DC | PRN
  Administered 2023-06-18: 1000 mg via TOPICAL

## 2023-06-18 MED ORDER — MIDAZOLAM HCL 2 MG/2ML IJ SOLN
INTRAMUSCULAR | Status: AC
  Filled 2023-06-18: qty 2

## 2023-06-18 MED ORDER — ACETAMINOPHEN 500 MG PO TABS: 1000.0000 mg | ORAL_TABLET | Freq: Three times a day (TID) | ORAL | Status: AC | PRN

## 2023-06-18 MED ORDER — ONDANSETRON HCL 4 MG PO TABS: 4.0000 mg | ORAL_TABLET | Freq: Three times a day (TID) | ORAL | 0 refills | Status: AC | PRN

## 2023-06-18 MED ORDER — ONDANSETRON HCL 4 MG/2ML IJ SOLN
INTRAMUSCULAR | Status: DC | PRN
  Administered 2023-06-18: 4 mg via INTRAVENOUS

## 2023-06-18 MED ORDER — CHLORHEXIDINE GLUCONATE 0.12 % MT SOLN
15.0000 mL | Freq: Once | OROMUCOSAL | Status: AC
  Administered 2023-06-18: 15 mL via OROMUCOSAL

## 2023-06-18 MED ORDER — SCOPOLAMINE 1 MG/3DAYS TD PT72
1.0000 | MEDICATED_PATCH | Freq: Once | TRANSDERMAL | Status: DC
  Administered 2023-06-18: 1.5 mg via TRANSDERMAL
  Filled 2023-06-18: qty 1

## 2023-06-18 MED ORDER — LIDOCAINE 2% (20 MG/ML) 5 ML SYRINGE: INTRAMUSCULAR | Status: DC | PRN

## 2023-06-18 MED ORDER — ACETAMINOPHEN 500 MG PO TABS
1000.0000 mg | ORAL_TABLET | Freq: Once | ORAL | Status: AC
  Administered 2023-06-18: 1000 mg via ORAL
  Filled 2023-06-18: qty 2

## 2023-06-18 MED ORDER — ORAL CARE MOUTH RINSE: 15.0000 mL | Freq: Once | OROMUCOSAL | Status: AC

## 2023-06-18 MED ORDER — FENTANYL CITRATE PF 50 MCG/ML IJ SOSY
50.0000 ug | PREFILLED_SYRINGE | Freq: Once | INTRAMUSCULAR | Status: AC
  Administered 2023-06-18: 50 ug via INTRAVENOUS

## 2023-06-18 MED ORDER — MIDAZOLAM HCL 2 MG/2ML IJ SOLN
2.0000 mg | Freq: Once | INTRAMUSCULAR | Status: AC
  Administered 2023-06-18: 2 mg via INTRAVENOUS

## 2023-06-18 MED ORDER — 0.9 % SODIUM CHLORIDE (POUR BTL) OPTIME
TOPICAL | Status: DC | PRN
  Administered 2023-06-18: 1000 mL

## 2023-06-18 MED ORDER — LIDOCAINE HCL (PF) 2 % IJ SOLN
INTRAMUSCULAR | Status: DC | PRN
  Administered 2023-06-18: 100 mg via INTRADERMAL

## 2023-06-18 MED ORDER — HYDROCODONE-ACETAMINOPHEN 5-325 MG PO TABS
1.0000 | ORAL_TABLET | ORAL | 0 refills | Status: AC | PRN
Start: 2023-06-18 — End: 2023-06-25

## 2023-06-18 MED ORDER — DEXAMETHASONE SODIUM PHOSPHATE 10 MG/ML IJ SOLN
INTRAMUSCULAR | Status: DC | PRN
  Administered 2023-06-18: 10 mg via INTRAVENOUS

## 2023-06-18 MED ORDER — OXYCODONE HCL 5 MG/5ML PO SOLN: 5.0000 mg | Freq: Once | ORAL | Status: DC | PRN

## 2023-06-18 MED ORDER — BUPIVACAINE-EPINEPHRINE (PF) 0.5% -1:200000 IJ SOLN
INTRAMUSCULAR | Status: DC | PRN
  Administered 2023-06-18: 10 mL via PERINEURAL
  Administered 2023-06-18: 20 mL via PERINEURAL

## 2023-06-18 MED ORDER — LIDOCAINE HCL (PF) 2 % IJ SOLN
INTRAMUSCULAR | Status: AC
  Filled 2023-06-18: qty 5

## 2023-06-18 SURGICAL SUPPLY — 55 items
BAG COUNTER SPONGE SURGICOUNT (BAG) ×1 IMPLANT
BANDAGE ESMARK 6X9 LF (GAUZE/BANDAGES/DRESSINGS) IMPLANT
BIT DRILL 2 CANN GRADUATED (BIT) IMPLANT
BIT DRILL 2.5 CANN LNG (BIT) IMPLANT
BIT DRILL 2.7X2.7/3XSCR ANKL (BIT) IMPLANT
BIT DRL 2.7X2.7/3XSCR ANKL (BIT) ×1 IMPLANT
BNDG COHESIVE 4X5 TAN STRL LF (GAUZE/BANDAGES/DRESSINGS) ×1 IMPLANT
BNDG ELASTIC 4INX 5YD STR LF (GAUZE/BANDAGES/DRESSINGS) IMPLANT
BNDG ELASTIC 6INX 5YD STR LF (GAUZE/BANDAGES/DRESSINGS) IMPLANT
BNDG ESMARK 6X9 LF (GAUZE/BANDAGES/DRESSINGS) ×1 IMPLANT
CHLORAPREP W/TINT 26 (MISCELLANEOUS) ×2 IMPLANT
COVER SURGICAL LIGHT HANDLE (MISCELLANEOUS) ×1 IMPLANT
CUFF TRNQT CYL 34X4.125X (TOURNIQUET CUFF) ×1 IMPLANT
DERMABOND ADVANCED .7 DNX12 (GAUZE/BANDAGES/DRESSINGS) ×1 IMPLANT
DRAPE 3/4 80X56 (DRAPES) ×1 IMPLANT
DRAPE C-ARM 42X120 X-RAY (DRAPES) ×1 IMPLANT
DRAPE C-ARMOR (DRAPES) ×1 IMPLANT
DRAPE U-SHAPE 47X51 STRL (DRAPES) ×1 IMPLANT
DRSG ADAPTIC 3X8 NADH LF (GAUZE/BANDAGES/DRESSINGS) IMPLANT
ELECT REM PT RETURN 15FT ADLT (MISCELLANEOUS) ×1 IMPLANT
GAUZE PAD ABD 8X10 STRL (GAUZE/BANDAGES/DRESSINGS) IMPLANT
GAUZE SPONGE 4X4 12PLY STRL (GAUZE/BANDAGES/DRESSINGS) IMPLANT
GLOVE BIO SURGEON STRL SZ8 (GLOVE) ×2 IMPLANT
GLOVE BIOGEL PI IND STRL 8 (GLOVE) ×2 IMPLANT
GLOVE BIOGEL PI IND STRL 9 (GLOVE) ×1 IMPLANT
GOWN STRL REUS W/ TWL LRG LVL3 (GOWN DISPOSABLE) IMPLANT
GOWN STRL REUS W/ TWL XL LVL3 (GOWN DISPOSABLE) ×1 IMPLANT
IMPL TIGHTROP W/DRV K-LESS (Anchor) IMPLANT
IMPLANT TIGHTROPE W/DRV K-LESS (Anchor) ×1 IMPLANT
K-WIRE BB-TAK (WIRE) ×2 IMPLANT
KIT BASIN OR (CUSTOM PROCEDURE TRAY) ×1 IMPLANT
KIT TURNOVER KIT A (KITS) IMPLANT
KWIRE BB-TAK (WIRE) IMPLANT
MANIFOLD NEPTUNE II (INSTRUMENTS) IMPLANT
NS IRRIG 1000ML POUR BTL (IV SOLUTION) ×1 IMPLANT
PACK ORTHO EXTREMITY (CUSTOM PROCEDURE TRAY) ×1 IMPLANT
PAD ARMBOARD POSITIONER FOAM (MISCELLANEOUS) ×2 IMPLANT
PAD CAST 4YDX4 CTTN HI CHSV (CAST SUPPLIES) IMPLANT
PADDING CAST COTTON 6X4 STRL (CAST SUPPLIES) IMPLANT
PLATE DISTAL 4 HOLE (Plate) IMPLANT
SCREW BN T10 FT 20X2.7XST CORT (Screw) IMPLANT
SCREW COMP KREULOCK 2.7X12 (Screw) IMPLANT
SCREW COMP KREULOCK 2.7X14 (Screw) IMPLANT
SCREW LOW PROFILE 3.5X14 (Screw) IMPLANT
SPLINT PLASTER CAST FAST 5X30 (CAST SUPPLIES) IMPLANT
STRIP CLOSURE SKIN 1/2X4 (GAUZE/BANDAGES/DRESSINGS) IMPLANT
SUCTION TUBE FRAZIER 10FR DISP (SUCTIONS) ×1 IMPLANT
SUT ETHILON 3 0 PS 1 (SUTURE) ×4 IMPLANT
SUT VIC AB 0 CT1 36 (SUTURE) ×1 IMPLANT
SUT VIC AB 2-0 CT1 TAPERPNT 27 (SUTURE) ×2 IMPLANT
SYR BULB IRRIG 60ML STRL (SYRINGE) ×1 IMPLANT
TOWEL OR 17X26 10 PK STRL BLUE (TOWEL DISPOSABLE) ×1 IMPLANT
UNDERPAD 30X36 HEAVY ABSORB (UNDERPADS AND DIAPERS) ×1 IMPLANT
WATER STERILE IRR 1000ML POUR (IV SOLUTION) ×1 IMPLANT
YANKAUER SUCT BULB TIP NO VENT (SUCTIONS) IMPLANT

## 2023-06-18 NOTE — Anesthesia Procedure Notes (Signed)
 Anesthesia Regional Block: Popliteal block   Pre-Anesthetic Checklist: , timeout performed,  Correct Patient, Correct Site, Correct Laterality,  Correct Procedure, Correct Position, site marked,  Risks and benefits discussed,  Pre-op evaluation,  At surgeon's request and post-op pain management  Laterality: Right  Prep: Maximum Sterile Barrier Precautions used, chloraprep       Needles:  Injection technique: Single-shot  Needle Type: Echogenic Stimulator Needle     Needle Length: 9cm  Needle Gauge: 22     Additional Needles:   Procedures:,,,, ultrasound used (permanent image in chart),,    Narrative:  Start time: 06/18/2023 9:46 AM End time: 06/18/2023 9:49 AM Injection made incrementally with aspirations every 5 mL.  Performed by: Personally  Anesthesiologist: Kaylyn Layer, MD  Additional Notes: Risks, benefits, and alternative discussed. Patient gave consent for procedure. Patient prepped and draped in sterile fashion. Sedation administered, patient remains easily responsive to voice. Relevant anatomy identified with ultrasound guidance. Local anesthetic given in 5cc increments with no signs or symptoms of intravascular injection. No pain or paraesthesias with injection. Patient monitored throughout procedure with signs of LAST or immediate complications. Tolerated well. Ultrasound image placed in chart.  Amalia Greenhouse, MD

## 2023-06-18 NOTE — Transfer of Care (Signed)
 Immediate Anesthesia Transfer of Care Note  Patient: Jamie Bautista  Procedure(s) Performed: OPEN REDUCTION INTERNAL FIXATION (ORIF) ANKLE FRACTURE (Right: Ankle)  Patient Location: PACU  Anesthesia Type:General  Level of Consciousness: sedated  Airway & Oxygen Therapy: Patient Spontanous Breathing and Patient connected to face mask oxygen  Post-op Assessment: Report given to RN and Post -op Vital signs reviewed and stable  Post vital signs: Reviewed and stable  Last Vitals:  Vitals Value Taken Time  BP    Temp    Pulse 57 06/18/23 1158  Resp 15 06/18/23 1158  SpO2 100 % 06/18/23 1158  Vitals shown include unfiled device data.  Last Pain:  Vitals:   06/18/23 1000  TempSrc:   PainSc: 0-No pain      Patients Stated Pain Goal: 6 (06/18/23 0816)  Complications: No notable events documented.

## 2023-06-18 NOTE — Anesthesia Procedure Notes (Signed)
 Anesthesia Regional Block: Adductor canal block   Pre-Anesthetic Checklist: , timeout performed,  Correct Patient, Correct Site, Correct Laterality,  Correct Procedure, Correct Position, site marked,  Risks and benefits discussed,  Pre-op evaluation,  At surgeon's request and post-op pain management  Laterality: Right  Prep: Maximum Sterile Barrier Precautions used, chloraprep       Needles:  Injection technique: Single-shot  Needle Type: Echogenic Stimulator Needle     Needle Length: 9cm  Needle Gauge: 22     Additional Needles:   Procedures:,,,, ultrasound used (permanent image in chart),,    Narrative:  Start time: 06/18/2023 9:44 AM End time: 06/18/2023 9:46 AM Injection made incrementally with aspirations every 5 mL.  Performed by: Personally  Anesthesiologist: Kaylyn Layer, MD  Additional Notes: Risks, benefits, and alternative discussed. Patient gave consent for procedure. Patient prepped and draped in sterile fashion. Sedation administered, patient remains easily responsive to voice. Relevant anatomy identified with ultrasound guidance. Local anesthetic given in 5cc increments with no signs or symptoms of intravascular injection. No pain or paraesthesias with injection. Patient monitored throughout procedure with signs of LAST or immediate complications. Tolerated well. Ultrasound image placed in chart.  Amalia Greenhouse, MD
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
## 2023-06-18 NOTE — Anesthesia Procedure Notes (Signed)
 Procedure Name: LMA Insertion Date/Time: 06/18/2023 10:14 AM  Performed by: Doran Clay, CRNAPre-anesthesia Checklist: Patient identified, Emergency Drugs available, Suction available, Patient being monitored and Timeout performed Patient Re-evaluated:Patient Re-evaluated prior to induction Oxygen Delivery Method: Circle system utilized Preoxygenation: Pre-oxygenation with 100% oxygen Induction Type: IV induction LMA: LMA inserted LMA Size: 4.0 Tube type: Oral Number of attempts: 1 Placement Confirmation: positive ETCO2 and breath sounds checked- equal and bilateral Tube secured with: Tape Dental Injury: Teeth and Oropharynx as per pre-operative assessment

## 2023-06-18 NOTE — Discharge Instructions (Signed)
 Orthopedic Discharge Instructions  Diet: As you were doing prior to hospitalization   Shower:  May shower but keep the wounds dry, use an occlusive plastic wrap, NO SOAKING IN TUB.  If the bandage gets wet, change with a clean dry gauze.  If you have a splint on, leave the splint in place and keep the splint dry with a plastic bag.  Dressing:  You may change your dressing 3-5 days after surgery, unless you have a splint.  If the dressing remains clean and dry it can also be left on until follow up.  If you change the dressing replace with clean gauze and tape or ace wrap.  If you have a splint, then just leave the splint in place and we will change your bandages during your first follow-up appointment.  If water gets in the splint or the splint gets saturated please call the clinic and we can see you to change your splint.  For most surgeries, the stitches used are dissolvable and don't need to be removed.  However, depending on your surgery, you may have stitches that will need to be removed in the office in about 2 weeks.    Activity:  Increase activity slowly as tolerated, but follow the weight bearing instructions below.  Do not drive for the next 6 weeks.  In addition, you cannot be taking narcotics while you drive, and you must feel in control of the vehicle.    Weight Bearing:   Please refrain from bearing weight on your surgical leg for at least 2 weeks until can be re-evaluated at follow up.    Blood clot prevention (DVT Prophylaxis): After surgery you are at an increased risk for a blood clot. you were prescribed a blood thinner, Aspirin 81mg , to be taken twice daily for a total of 4 weeks from surgery to help reduce your risk of getting a blood clot. Signs of a pulmonary embolus (blood clot in the lungs) include sudden short of breath, feeling lightheaded or dizzy, chest pain with a deep breath, rapid pulse rapid breathing.  Signs of a blood clot in your arms or legs include new unexplained  swelling and cramping, warm, red or darkened skin around the painful area.  Please call the office or 911 right away if these signs or symptoms develop.  To prevent constipation: you may use a stool softener such as - Colace (over the counter) 100 mg by mouth twice a day  Drink plenty of fluids (prune juice may be helpful) and high fiber foods Miralax (over the counter) for constipation as needed.    Itching:  If you experience itching with your medications, try taking only a single pain pill, or even half a pain pill at a time.  You may take up to 10 pain pills per day, and you can also use benadryl over the counter for itching or also to help with sleep.   Precautions:  If you experience chest pain or shortness of breath - call 911 immediately for transfer to the hospital emergency department!!  Call office [phone]) for the following: Temperature greater than 101F Persistent nausea and vomiting Severe uncontrolled pain Redness, tenderness, or signs of infection (pain, swelling, redness, odor or green/yellow discharge around the site) Difficulty breathing, headache or visual disturbances Hives Persistent dizziness or light-headedness Extreme fatigue Any other questions or concerns you may have after discharge  In an emergency, call 911 or go to an Emergency Department at a nearby hospital  Follow- Up Appointment:  Please call for an appointment to be seen approximately 2-3 week after surgery in [facility] with your surgeon Dr. [name] - [phone] Address: [address], [place], [place] [address]

## 2023-06-18 NOTE — Op Note (Signed)
 06/18/2023  PATIENT:  Jamie Bautista    PRE-OPERATIVE DIAGNOSIS: Right displaced lateral malleolus ankle fracture with medial space widening  POST-OPERATIVE DIAGNOSIS:  Same  PROCEDURE: Open reduction term fixation right distal fibula fracture and suture button fixation of the right ankle syndesmosis  SURGEON:  Kayce Chismar A Owyn Raulston, MD  PHYSICIAN ASSISTANT: Kathie Dike, PA-C, present and scrubbed throughout the case, critical for completion in a timely fashion, and for retraction, instrumentation, and closure.  ANESTHESIA:   General  ESTIMATED BLOOD LOSS: 100cc  PREOPERATIVE INDICATIONS:  Jamie Bautista is a  28 y.o. female with a diagnosis of RIGHT ANKLE FRACTURE who elected for surgical management to minimize the risk for malunion and nonunion and post-traumatic arthritis.    The risks benefits and alternatives were discussed with the patient preoperatively including but not limited to the risks of infection, bleeding, nerve injury, cardiopulmonary complications, the need for revision surgery, the need for hardware removal, among others, and the patient was willing to proceed.  OPERATIVE IMPLANTS:  Implant Name Type Inv. Item Serial No. Manufacturer Lot No. LRB No. Used Action  IMPLANT TIGHTROPE W/DRV K-LESS - [id number] Anchor IMPLANT TIGHTROPE W/DRV [employer] [id number] Right 1 Implanted  PLATE DISTAL 4 HOLE - [id number] Plate PLATE DISTAL 4 HOLE  [employer]  Right 1 Implanted  SCREW LOW PROFILE 3.5X14 - [id number] Screw SCREW LOW PROFILE 3.5X14  [employer]  Right 2 Implanted  SCREW COMP KREULOCK 2.7X12 - [id number] Screw SCREW COMP KREULOCK 2.7X12  [employer]  Right 2 Implanted  SCREW COMP KREULOCK 2.7X14 - [id number] Screw SCREW COMP KREULOCK 2.7X14  [employer]  Right 2 Implanted  SCREW BN T10 FT 20X2.7XST CORT - [id number] Screw SCREW BN T10 FT 20X2.7XST CORT  [employer]  Right 1 Implanted    OPERATIVE PROCEDURE: The patient was brought to the operating room and  placed in the supine position. All bony prominences were padded. Non sterile Tourniquet was placed but not inflated. General anesthesia was administered. The lower extremity was prepped and draped in the usual sterile fashion.  Time out was performed.   Incision was made over the distal fibula and the fracture was exposed and reduced anatomically with a clamp. A lag screw was placed. Dissection for the plate proximally was performed with metz to bluntly spread and to protect crossing branches of the superficial peroneal nerve. I then applied an appropriately sized distal fibular locking plate and secured it proximally with non locking screws and distally with locking screws. Bone quality was good. I used c-arm to confirm satisfactory reduction and fixation.   The syndesmosis was stressed using live fluoroscopy and found to have both syndesmotic and medial clear space widening.  Given no medial malleolus fracture elected for fixation of the syndesmosis.  Had to hold options for syndesmotic fixation the more distal hole was right at the lag screw and fracture the hole above was 2.5 cm above the joint line and was clear of the fracture and lag screw so elected to use this hold.  A K wire was placed quad cortical through the fibula and tibia.  Position the K wire was confirmed on fluoroscopy.  The K wire was angled anteriorly approximately 30 degrees.  Pleased with the position we then overdrilled the K wire.  The tight rope suture button was then passed through the hole.  This was carefully tensioned under fluoroscopy.  Once we had adequate fixation and closure of the syndesmosis and medial clear space the ankle  was again stressed and found to be stable.  The wounds were irrigated and vancomycin powder was placed into the wound, and closed with 0 Vicryl, 2-0 vicryl.  3-0 nylon closure for the skin.Adaptic and Sterile gauze was applied followed by a posterior splint. She was awakened and returned to the PACU in  stable and satisfactory condition. There were no complications.  Post op recs: WB: NWB RLE in splint Imaging: PACU xrays Dressing: keep splint intact until follow up DVT prophylaxis: aspirin 81mg  BID x4 weeks Follow up: 2 weeks after surgery for a wound check and suture removal with Dr. Blanchie Dessert at [facility].  Address: [address], [place], [place] [address]  Office Phone: [phone]  [name], MD Orthopaedic Surgery

## 2023-06-18 NOTE — Anesthesia Postprocedure Evaluation (Signed)
 Anesthesia Post Note  Patient: Jamie Bautista  Procedure(s) Performed: OPEN REDUCTION INTERNAL FIXATION (ORIF) ANKLE FRACTURE (Right: Ankle)     Patient location during evaluation: PACU Anesthesia Type: General Level of consciousness: awake and alert Pain management: pain level controlled Vital Signs Assessment: post-procedure vital signs reviewed and stable Respiratory status: spontaneous breathing, nonlabored ventilation and respiratory function stable Cardiovascular status: blood pressure returned to baseline Postop Assessment: no apparent nausea or vomiting Anesthetic complications: no   No notable events documented.  Last Vitals:  Vitals:   06/18/23 1230 06/18/23 1245  BP: 124/78 127/64  Pulse: 71 (!) 57  Resp: 20 20  Temp:  36.6 C  SpO2: 97% 98%    Last Pain:  Vitals:   06/18/23 1230  TempSrc:   PainSc: 0-No pain                 Shanda Howells

## 2023-06-19 ENCOUNTER — Encounter (HOSPITAL_COMMUNITY): Payer: Self-pay | Admitting: Orthopedic Surgery

## 2023-07-01 DIAGNOSIS — M25571 Pain in right ankle and joints of right foot: Secondary | ICD-10-CM | POA: Diagnosis not present
